# Patient Record
Sex: Male | Born: 1970 | Race: White | Hispanic: No | Marital: Married | Smoking: Current every day smoker
Health system: Southern US, Community
[De-identification: ages and names within clinical notes are randomized; demographics above are authoritative.]

## PROBLEM LIST (undated history)

## (undated) DIAGNOSIS — F329 Major depressive disorder, single episode, unspecified: Secondary | ICD-10-CM

## (undated) DIAGNOSIS — K8020 Calculus of gallbladder without cholecystitis without obstruction: Secondary | ICD-10-CM

## (undated) DIAGNOSIS — M1990 Unspecified osteoarthritis, unspecified site: Secondary | ICD-10-CM

## (undated) DIAGNOSIS — F419 Anxiety disorder, unspecified: Secondary | ICD-10-CM

## (undated) DIAGNOSIS — E785 Hyperlipidemia, unspecified: Secondary | ICD-10-CM

## (undated) DIAGNOSIS — Z8719 Personal history of other diseases of the digestive system: Secondary | ICD-10-CM

## (undated) DIAGNOSIS — K2270 Barrett's esophagus without dysplasia: Secondary | ICD-10-CM

## (undated) DIAGNOSIS — R0600 Dyspnea, unspecified: Secondary | ICD-10-CM

## (undated) DIAGNOSIS — F32A Depression, unspecified: Secondary | ICD-10-CM

## (undated) DIAGNOSIS — K219 Gastro-esophageal reflux disease without esophagitis: Secondary | ICD-10-CM

## (undated) DIAGNOSIS — J189 Pneumonia, unspecified organism: Secondary | ICD-10-CM

## (undated) DIAGNOSIS — I209 Angina pectoris, unspecified: Secondary | ICD-10-CM

## (undated) HISTORY — PX: BONE MARROW ASPIRATION: SHX1252

## (undated) HISTORY — PX: OTHER SURGICAL HISTORY: SHX169

## (undated) HISTORY — PX: NO PAST SURGERIES: SHX2092

## (undated) HISTORY — DX: Barrett's esophagus without dysplasia: K22.70

## (undated) HISTORY — PX: CHOLECYSTECTOMY: SHX55

---

## 2015-10-06 ENCOUNTER — Emergency Department: Payer: BLUE CROSS/BLUE SHIELD

## 2015-10-06 ENCOUNTER — Encounter: Payer: Self-pay | Admitting: *Deleted

## 2015-10-06 ENCOUNTER — Emergency Department
Admission: EM | Admit: 2015-10-06 | Discharge: 2015-10-06 | Disposition: A | Discharge status: Home / Self Care | Payer: BLUE CROSS/BLUE SHIELD | Attending: Emergency Medicine | Admitting: Emergency Medicine

## 2015-10-06 DIAGNOSIS — R1010 Upper abdominal pain, unspecified: Secondary | ICD-10-CM | POA: Insufficient documentation

## 2015-10-06 DIAGNOSIS — R1013 Epigastric pain: Secondary | ICD-10-CM | POA: Diagnosis not present

## 2015-10-06 DIAGNOSIS — R079 Chest pain, unspecified: Secondary | ICD-10-CM | POA: Diagnosis present

## 2015-10-06 DIAGNOSIS — F17200 Nicotine dependence, unspecified, uncomplicated: Secondary | ICD-10-CM | POA: Diagnosis not present

## 2015-10-06 HISTORY — DX: Anxiety disorder, unspecified: F41.9

## 2015-10-06 HISTORY — DX: Gastro-esophageal reflux disease without esophagitis: K21.9

## 2015-10-06 HISTORY — DX: Depression, unspecified: F32.A

## 2015-10-06 HISTORY — DX: Major depressive disorder, single episode, unspecified: F32.9

## 2015-10-06 LAB — BASIC METABOLIC PANEL
Anion gap: 7 (ref 5–15)
BUN: 10 mg/dL (ref 6–20)
CALCIUM: 9.7 mg/dL (ref 8.9–10.3)
CHLORIDE: 103 mmol/L (ref 101–111)
CO2: 27 mmol/L (ref 22–32)
CREATININE: 1.08 mg/dL (ref 0.61–1.24)
Glucose, Bld: 104 mg/dL — ABNORMAL HIGH (ref 65–99)
Potassium: 4.5 mmol/L (ref 3.5–5.1)
SODIUM: 137 mmol/L (ref 135–145)

## 2015-10-06 LAB — CBC
HCT: 49.5 % (ref 40.0–52.0)
Hemoglobin: 17.3 g/dL (ref 13.0–18.0)
MCH: 33.3 pg (ref 26.0–34.0)
MCHC: 34.9 g/dL (ref 32.0–36.0)
MCV: 95.4 fL (ref 80.0–100.0)
PLATELETS: 191 10*3/uL (ref 150–440)
RBC: 5.19 MIL/uL (ref 4.40–5.90)
RDW: 12.7 % (ref 11.5–14.5)
WBC: 7.9 10*3/uL (ref 3.8–10.6)

## 2015-10-06 LAB — LIPASE, BLOOD: LIPASE: 44 U/L (ref 11–51)

## 2015-10-06 LAB — TROPONIN I

## 2015-10-06 LAB — HEPATIC FUNCTION PANEL
ALBUMIN: 4.6 g/dL (ref 3.5–5.0)
ALT: 36 U/L (ref 17–63)
AST: 23 U/L (ref 15–41)
Alkaline Phosphatase: 63 U/L (ref 38–126)
Bilirubin, Direct: <0.1 mg/dL — ABNORMAL LOW (ref 0.1–0.5)
TOTAL PROTEIN: 7.8 g/dL (ref 6.5–8.1)
Total Bilirubin: 0.8 mg/dL (ref 0.3–1.2)

## 2015-10-06 MED ORDER — SUCRALFATE 1 G PO TABS: 1.0000 g | ORAL_TABLET | Freq: Four times a day (QID) | ORAL | Status: DC

## 2015-10-06 NOTE — Discharge Instructions (Signed)
Please seek medical attention for any high fevers, chest pain, shortness of breath, change in behavior, persistent vomiting, bloody stool or any other new or concerning symptoms. ° ° °Gastritis, Adult °Gastritis is soreness and swelling (inflammation) of the lining of the stomach. Gastritis can develop as a sudden onset (acute) or long-term (chronic) condition. If gastritis is not treated, it can lead to stomach bleeding and ulcers. °CAUSES  °Gastritis occurs when the stomach lining is weak or damaged. Digestive juices from the stomach then inflame the weakened stomach lining. The stomach lining may be weak or damaged due to viral or bacterial infections. One common bacterial infection is the Helicobacter pylori infection. Gastritis can also result from excessive alcohol consumption, taking certain medicines, or having too much acid in the stomach.  °SYMPTOMS  °In some cases, there are no symptoms. When symptoms are present, they may include: °· Pain or a burning sensation in the upper abdomen. °· Nausea. °· Vomiting. °· An uncomfortable feeling of fullness after eating. °DIAGNOSIS  °Your caregiver may suspect you have gastritis based on your symptoms and a physical exam. To determine the cause of your gastritis, your caregiver may perform the following: °· Blood or stool tests to check for the H pylori bacterium. °· Gastroscopy. A thin, flexible tube (endoscope) is passed down the esophagus and into the stomach. The endoscope has a light and camera on the end. Your caregiver uses the endoscope to view the inside of the stomach. °· Taking a tissue sample (biopsy) from the stomach to examine under a microscope. °TREATMENT  °Depending on the cause of your gastritis, medicines may be prescribed. If you have a bacterial infection, such as an H pylori infection, antibiotics may be given. If your gastritis is caused by too much acid in the stomach, H2 blockers or antacids may be given. Your caregiver may recommend that  you stop taking aspirin, ibuprofen, or other nonsteroidal anti-inflammatory drugs (NSAIDs). °HOME CARE INSTRUCTIONS °· Only take over-the-counter or prescription medicines as directed by your caregiver. °· If you were given antibiotic medicines, take them as directed. Finish them even if you start to feel better. °· Drink enough fluids to keep your urine clear or pale yellow. °· Avoid foods and drinks that make your symptoms worse, such as: °¨ Caffeine or alcoholic drinks. °¨ Chocolate. °¨ Peppermint or mint flavorings. °¨ Garlic and onions. °¨ Spicy foods. °¨ Citrus fruits, such as oranges, lemons, or limes. °¨ Tomato-based foods such as sauce, chili, salsa, and pizza. °¨ Fried and fatty foods. °· Eat small, frequent meals instead of large meals. °SEEK IMMEDIATE MEDICAL CARE IF:  °· You have black or dark red stools. °· You vomit blood or material that looks like coffee grounds. °· You are unable to keep fluids down. °· Your abdominal pain gets worse. °· You have a fever. °· You do not feel better after 1 week. °· You have any other questions or concerns. °MAKE SURE YOU: °· Understand these instructions. °· Will watch your condition. °· Will get help right away if you are not doing well or get worse. °  °This information is not intended to replace advice given to you by your health care provider. Make sure you discuss any questions you have with your health care provider. °  °Document Released: 11/01/2001 Document Revised: 05/08/2012 Document Reviewed: 12/21/2011 °Elsevier Interactive Patient Education ©2016 [employer] ° °

## 2015-10-06 NOTE — ED Notes (Signed)
Lab called about adding hepatic function panel and lipase.

## 2015-10-06 NOTE — ED Notes (Signed)
Lab ([place]) called again about lipase and hepatic function panel. Will check on results.

## 2015-10-06 NOTE — ED Provider Notes (Signed)
[facility] Emergency Department Provider Note    ____________________________________________  Time seen: 1725  I have reviewed the triage vital signs and the nursing notes.   HISTORY  Chief Complaint Chest Pain   History limited by: Not Limited   HPI Phillip Finley is a 44 y.o. male who presents to the emergency department today because of concerns for intermittent chest pain. He describes the pain as stabbing. It will come and go. He has not identified any eliciting or relieving factors. He describes it being located in the epigastric and low mid chest. He denies any radiation. He has had some associated nausea. He denies any chest pain or diaphoresis. Denies any fevers.   Past Medical History  Diagnosis Date  . GERD (gastroesophageal reflux disease)   . Anxiety   . Depression     There are no active problems to display for this patient.   History reviewed. No pertinent past surgical history.  No current outpatient prescriptions on file.  Allergies Review of patient's allergies indicates no known allergies.  No family history on file.  Social History Social History  Substance Use Topics  . Smoking status: Current Every Day Smoker  . Smokeless tobacco: None  . Alcohol Use: No    Review of Systems  Constitutional: Negative for fever. Cardiovascular: Positive for lower chest pain Respiratory: Negative for shortness of breath. Gastrointestinal: Positive for upper abdominal pain. Genitourinary: Negative for dysuria. Musculoskeletal: Negative for back pain. Skin: Negative for rash. Neurological: Negative for headaches, focal weakness or numbness.  10-point ROS otherwise negative.  ____________________________________________   PHYSICAL EXAM:  VITAL SIGNS: ED Triage Vitals  Enc Vitals Group     BP 10/06/15 1637 120/73 mmHg     Pulse Rate 10/06/15 1637 68     Resp 10/06/15 1637 24     Temp 10/06/15 1637 98.1 F (36.7 C)   Temp Source 10/06/15 1637 Oral     SpO2 10/06/15 1637 100 %     Weight 10/06/15 1637 220 lb (99.791 kg)     Height 10/06/15 1637 5\' 10"  (1.778 m)     Head Cir --      Peak Flow --      Pain Score 10/06/15 1638 7   Constitutional: Alert and oriented. Well appearing and in no distress. Eyes: Conjunctivae are normal. PERRL. Normal extraocular movements. ENT   Head: Normocephalic and atraumatic.   Nose: No congestion/rhinnorhea.   Mouth/Throat: Mucous membranes are moist.   Neck: No stridor. Hematological/Lymphatic/Immunilogical: No cervical lymphadenopathy. Cardiovascular: Normal rate, regular rhythm.  No murmurs, rubs, or gallops. Respiratory: Normal respiratory effort without tachypnea nor retractions. Breath sounds are clear and equal bilaterally. No wheezes/rales/rhonchi. Gastrointestinal: Soft and nontender. No distention. There is no CVA tenderness. Genitourinary: Deferred Musculoskeletal: Normal range of motion in all extremities. No joint effusions.  No lower extremity tenderness nor edema. Neurologic:  Normal speech and language. No gross focal neurologic deficits are appreciated.  Skin:  Skin is warm, dry and intact. No rash noted. Psychiatric: Mood and affect are normal. Speech and behavior are normal. Patient exhibits appropriate insight and judgment.  ____________________________________________    LABS (pertinent positives/negatives)  Labs Reviewed  BASIC METABOLIC PANEL - Abnormal; Notable for the following:    Glucose, Bld 104 (*)    All other components within normal limits  HEPATIC FUNCTION PANEL - Abnormal; Notable for the following:    Bilirubin, Direct <0.1 (*)    All other components within normal limits  CBC  TROPONIN I  LIPASE, BLOOD     ____________________________________________   EKG  I, Nance Pear, attending physician, personally viewed and interpreted this EKG  EKG Time: 1635 Rate: 67 Rhythm: NSR Axis: normal Intervals:  qtc 395 QRS: narrow ST changes: no st elevation Impression: normal ekg ____________________________________________    RADIOLOGY  CXR IMPRESSION: No active cardiopulmonary disease.   ____________________________________________   PROCEDURES  Procedure(s) performed: None  Critical Care performed: No  ____________________________________________   INITIAL IMPRESSION / ASSESSMENT AND PLAN / ED COURSE  Pertinent labs & imaging results that were available during my care of the patient were reviewed by me and considered in my medical decision making (see chart for details).  Patient presented today because of intermittent lower chest/epigastric pain. On exam patient appears well. He did not have any abdominal tenderness. Blood work without any concerning findings. I did perform a bedside ultrasound which did not show any gallbladder stones. This point unclear however I think likely gastritis. Additionally patient did state that he tried some Mylanta which helped relieve some of his pain. Will discharge home with sucralfate. Patient states he is already on an antiacid. Discussed return precautions.  ____________________________________________   FINAL CLINICAL IMPRESSION(S) / ED DIAGNOSES  Final diagnoses:  Epigastric pain     Nance Pear, MD 10/06/15 1946

## 2015-10-06 NOTE — ED Notes (Signed)
Episodes of stabbing feeling in chest past few days, some nausea, today felt cold and had nausea, pain more severe today

## 2015-11-02 ENCOUNTER — Encounter: Payer: Self-pay | Admitting: Family Medicine

## 2015-11-02 ENCOUNTER — Ambulatory Visit (INDEPENDENT_AMBULATORY_CARE_PROVIDER_SITE_OTHER): Payer: BLUE CROSS/BLUE SHIELD | Admitting: Family Medicine

## 2015-11-02 VITALS — BP 133/83 | HR 74 | Temp 98.3°F | Resp 16 | Ht 70.0 in | Wt 228.2 lb

## 2015-11-02 DIAGNOSIS — Z720 Tobacco use: Secondary | ICD-10-CM

## 2015-11-02 DIAGNOSIS — Z8249 Family history of ischemic heart disease and other diseases of the circulatory system: Secondary | ICD-10-CM | POA: Diagnosis not present

## 2015-11-02 DIAGNOSIS — E785 Hyperlipidemia, unspecified: Secondary | ICD-10-CM

## 2015-11-02 DIAGNOSIS — F419 Anxiety disorder, unspecified: Principal | ICD-10-CM

## 2015-11-02 DIAGNOSIS — K2270 Barrett's esophagus without dysplasia: Secondary | ICD-10-CM

## 2015-11-02 DIAGNOSIS — E559 Vitamin D deficiency, unspecified: Secondary | ICD-10-CM

## 2015-11-02 DIAGNOSIS — F329 Major depressive disorder, single episode, unspecified: Secondary | ICD-10-CM

## 2015-11-02 DIAGNOSIS — F411 Generalized anxiety disorder: Secondary | ICD-10-CM | POA: Insufficient documentation

## 2015-11-02 DIAGNOSIS — F418 Other specified anxiety disorders: Secondary | ICD-10-CM | POA: Diagnosis not present

## 2015-11-02 DIAGNOSIS — F17200 Nicotine dependence, unspecified, uncomplicated: Secondary | ICD-10-CM

## 2015-11-02 DIAGNOSIS — F32A Depression, unspecified: Secondary | ICD-10-CM

## 2015-11-02 MED ORDER — OMEPRAZOLE 20 MG PO CPDR: 20.0000 mg | DELAYED_RELEASE_CAPSULE | Freq: Every day | ORAL | Status: DC

## 2015-11-02 NOTE — Assessment & Plan Note (Signed)
Lipid panel to stratify risk. Recommend daily aspirin to reduce cardiovascular risk after GI evaluation.  Discussed possibility of seeing cardiology for risk evaluation. Pt declines at this time.

## 2015-11-02 NOTE — Assessment & Plan Note (Signed)
Continue omeprazole. Refer to GI  For evaluation.

## 2015-11-02 NOTE — Patient Instructions (Signed)
Anxiety: We will try without medication for a little bit. Please go to the ER if you feel you are a danger to yourself.   GERD: Please take your omeprazole daily.  We will have you over to Dr. Allen Norris to check on your Barretts Esophagitis.   Please get your labwork done and we will discuss a cholesterol medication.

## 2015-11-02 NOTE — Assessment & Plan Note (Signed)
In the past. Check Vitamin D. Recommend 2000IU vitamin D OTC for prophylaxis.

## 2015-11-02 NOTE — Assessment & Plan Note (Signed)
Encouraged smoking cessation. Pt is not ready at this time. Discussed increased risk for heart disease with smoking. 5 minutes counseling given.

## 2015-11-02 NOTE — Progress Notes (Signed)
Subjective:    Patient ID: Phillip Finley, male    DOB: June 23, 1971, 44 y.o.   MRN: [id number]  HPI: Phillip Finley is a 43 y.o. male presenting on 11/02/2015 for Establish Care   HPI  Pt presents to establish care today. Previous care provider was in [place].  It has been 2 months since His last PCP visit. Records from previous provider will be requested and reviewed. Current medical problems include:  Depression: Previous PCP had placed him on lamictal. He stopped taking it. Was also previously take risperdal and lexapro. He is not taking any medication.Currently out of work due to the medications he was on. Is endorsing it dark thoughts he might be better off dead due to stress in his life. Denies active suicidal ideation or plans.  100mg  of lamictal; 1 mg risperadol Recently in ER for gastritis: Did not take carafate. Has history of barretts esophagitis. Not currently taking omeprazole.  High cholesterol: was on medication in the past.  Vitamin D deficiency: IN the past- was taking vitamin D.   Smoker- 2 packs per day. No formal exercise.      Past Medical History  Diagnosis Date  . GERD (gastroesophageal reflux disease)   . Anxiety   . Depression    Social History   Social History  . Marital Status: Single    Spouse Name: N/A  . Number of Children: N/A  . Years of Education: N/A   Occupational History  . Not on file.   Social History Main Topics  . Smoking status: Current Every Day Smoker -- 2.00 packs/day  . Smokeless tobacco: Not on file  . Alcohol Use: No  . Drug Use: No  . Sexual Activity: Not on file   Other Topics Concern  . Not on file   Social History Narrative   Family History  Problem Relation Age of Onset  . Heart disease Father    No current outpatient prescriptions on file prior to visit.   No current facility-administered medications on file prior to visit.    Review of Systems  Constitutional: Negative for fever and chills.  HENT: Negative.    Respiratory: Negative for chest tightness, shortness of breath and wheezing.   Cardiovascular: Negative for chest pain, palpitations and leg swelling.  Gastrointestinal: Negative for nausea, vomiting and abdominal pain.  Endocrine: Negative.   Genitourinary: Negative for dysuria, urgency, discharge, penile pain and testicular pain.  Musculoskeletal: Negative for back pain, joint swelling and arthralgias.  Skin: Negative.   Neurological: Negative for dizziness, weakness, numbness and headaches.  Psychiatric/Behavioral: Negative for sleep disturbance and dysphoric mood.   Per HPI unless specifically indicated above     Objective:    BP 133/83 mmHg  Pulse 74  Temp(Src) 98.3 F (36.8 C) (Oral)  Resp 16  Ht 5\' 10"  (1.778 m)  Wt 228 lb 3.2 oz (103.511 kg)  BMI 32.74 kg/m2  Wt Readings from Last 3 Encounters:  11/02/15 228 lb 3.2 oz (103.511 kg)  10/06/15 220 lb (99.791 kg)    Physical Exam  Constitutional: He is oriented to person, place, and time. He appears well-developed and well-nourished. No distress.  HENT:  Head: Normocephalic and atraumatic.  Neck: Neck supple. No thyromegaly present.  Cardiovascular: Normal rate, regular rhythm and normal heart sounds.  Exam reveals no gallop and no friction rub.   No murmur heard. Pulmonary/Chest: Effort normal and breath sounds normal. He has no wheezes.  Abdominal: Soft. Normal appearance and bowel sounds are normal. He  exhibits no distension. There is no hepatosplenomegaly. There is tenderness in the epigastric area. There is no rebound and no CVA tenderness.  Musculoskeletal: Normal range of motion. He exhibits no edema or tenderness.  Neurological: He is alert and oriented to person, place, and time. He has normal reflexes.  Skin: Skin is warm and dry. No rash noted. No erythema.  Psychiatric: He has a normal mood and affect. His behavior is normal. Thought content normal.   Results for orders placed or performed during the  hospital encounter of [id number]  Basic metabolic panel  Result Value Ref Range   Sodium 137 135 - 145 mmol/L   Potassium 4.5 3.5 - 5.1 mmol/L   Chloride 103 101 - 111 mmol/L   CO2 27 22 - 32 mmol/L   Glucose, Bld 104 (H) 65 - 99 mg/dL   BUN 10 6 - 20 mg/dL   Creatinine, Ser 1.08 0.61 - 1.24 mg/dL   Calcium 9.7 8.9 - 10.3 mg/dL   GFR calc non Af Amer >60 >60 mL/min   GFR calc Af Amer >60 >60 mL/min   Anion gap 7 5 - 15  CBC  Result Value Ref Range   WBC 7.9 3.8 - 10.6 K/uL   RBC 5.19 4.40 - 5.90 MIL/uL   Hemoglobin 17.3 13.0 - 18.0 g/dL   HCT 49.5 40.0 - 52.0 %   MCV 95.4 80.0 - 100.0 fL   MCH 33.3 26.0 - 34.0 pg   MCHC 34.9 32.0 - 36.0 g/dL   RDW 12.7 11.5 - 14.5 %   Platelets 191 150 - 440 K/uL  Troponin I  Result Value Ref Range   Troponin I <0.03 <0.031 ng/mL  Lipase, blood  Result Value Ref Range   Lipase 44 11 - 51 U/L  Hepatic function panel  Result Value Ref Range   Total Protein 7.8 6.5 - 8.1 g/dL   Albumin 4.6 3.5 - 5.0 g/dL   AST 23 15 - 41 U/L   ALT 36 17 - 63 U/L   Alkaline Phosphatase 63 38 - 126 U/L   Total Bilirubin 0.8 0.3 - 1.2 mg/dL   Bilirubin, Direct <0.1 (L) 0.1 - 0.5 mg/dL   Indirect Bilirubin NOT CALCULATED 0.3 - 0.9 mg/dL      Assessment & Plan:   Problem List Items Addressed This Visit      Digestive   Barrett's esophagus    Continue omeprazole. Refer to GI  For evaluation.       Relevant Medications   omeprazole (PRILOSEC) 20 MG capsule   Other Relevant Orders   Comprehensive Metabolic Panel (CMET)   CBC with Differential/Platelet   Ambulatory referral to General Surgery     Other   Anxiety and depression - Primary    Pt would like to be off medication at this time.  Discussed exercise as a way to improve symptoms. 30 mins daily. Encouraged fish oil and vitamin D.  Due to self reported dark thoughts without SI: Safety contract made. Pt is aware to go to ER if he feels he is a danger to himself. Aware to call provider if thoughts  increase in frequency.       Relevant Orders   TSH   Vitamin D deficiency    In the past. Check Vitamin D. Recommend 2000IU vitamin D OTC for prophylaxis.      Relevant Orders   VITAMIN D 25 Hydroxy (Vit-D Deficiency, Fractures)   Hyperlipidemia    Check lipid panel. Start  statin based on ASCVD score. Encouraged diet and lifestyle changes. Encouraged fish oil 1000mg  OTC daily.       Relevant Orders   Lipid Profile   Smoker    Encouraged smoking cessation. Pt is not ready at this time. Discussed increased risk for heart disease with smoking. 5 minutes counseling given.       Family history of heart disease in male family member before age 105    Lipid panel to stratify risk. Recommend daily aspirin to reduce cardiovascular risk after GI evaluation.  Discussed possibility of seeing cardiology for risk evaluation. Pt declines at this time.          Meds ordered this encounter  Medications  . omeprazole (PRILOSEC) 20 MG capsule    Sig: Take 1 capsule (20 mg total) by mouth daily.    Dispense:  90 capsule    Refill:  3    Order Specific Question:  Supervising Provider    Answer:  Arlis Porta [id number]      Follow up plan: Return in about 4 weeks (around 11/30/2015) for Anxiety. Marland Kitchen

## 2015-11-02 NOTE — Assessment & Plan Note (Signed)
Pt would like to be off medication at this time.  Discussed exercise as a way to improve symptoms. 30 mins daily. Encouraged fish oil and vitamin D.  Due to self reported dark thoughts without SI: Safety contract made. Pt is aware to go to ER if he feels he is a danger to himself. Aware to call provider if thoughts increase in frequency.

## 2015-11-02 NOTE — Assessment & Plan Note (Signed)
Check lipid panel. Start statin based on ASCVD score. Encouraged diet and lifestyle changes. Encouraged fish oil 1000mg  OTC daily.

## 2015-11-04 ENCOUNTER — Telehealth: Payer: Self-pay | Admitting: Family Medicine

## 2015-11-04 NOTE — Telephone Encounter (Signed)
Called pt, LVM that forms are complete. Faxed back. I will leave copy at the front if he needs them. AK

## 2015-11-05 ENCOUNTER — Ambulatory Visit
Admission: EM | Admit: 2015-11-05 | Discharge: 2015-11-05 | Disposition: A | Discharge status: Home / Self Care | Payer: Self-pay | Attending: Family Medicine | Admitting: Family Medicine

## 2015-11-05 ENCOUNTER — Encounter: Payer: Self-pay | Admitting: Emergency Medicine

## 2015-11-05 DIAGNOSIS — Z029 Encounter for administrative examinations, unspecified: Secondary | ICD-10-CM

## 2015-11-05 DIAGNOSIS — Z024 Encounter for examination for driving license: Secondary | ICD-10-CM

## 2015-11-05 LAB — DEPT OF TRANSP DIPSTICK, URINE (ARMC ONLY)
Glucose, UA: NEGATIVE mg/dL
Hgb urine dipstick: NEGATIVE
Protein, ur: NEGATIVE mg/dL
Specific Gravity, Urine: >1.03 — ABNORMAL HIGH (ref 1.005–1.030)

## 2015-11-05 NOTE — ED Notes (Signed)
Patient here for DOT Physical.  

## 2015-11-05 NOTE — ED Notes (Signed)
Patient states to Tamala Ser, PA that he is tired of waiting and is leaving.

## 2015-11-05 NOTE — ED Notes (Signed)
For DOT Physical. Here earlier and walked out. [place] had called 2 hours ago and spoke with Tamala Ser PA

## 2015-11-05 NOTE — ED Provider Notes (Signed)
CSN: [id number]     Arrival date & time 11/05/15  1455 History   First MD Initiated Contact with Patient 11/05/15 1515     Chief Complaint  Patient presents with  . DOT Physical    (Consider location/radiation/quality/duration/timing/severity/associated sxs/prior Treatment) HPI  Patient presents for a DOT physical. Initially left without being seen dating he waited too long a period. He returned several hours later. He states that his only problem is some GERD which he takes  omeprazole. Stated that he did take Lamictal scribed by his primary care physician; that it affected his driving status so he discontinued its use and states that he is no longer taking that medication.  denies any surgical past history. He denies any heart disease signs or symptoms  of obstructive sleep apnea or narcolepsy. Problem list also has imitated vitamin D deficiency and Barrett's esophagitis along with his anxiety depression.  Past Medical History  Diagnosis Date  . GERD (gastroesophageal reflux disease)   . Anxiety   . Depression    No past surgical history on file. Family History  Problem Relation Age of Onset  . Heart disease Father    Social History  Substance Use Topics  . Smoking status: Current Every Day Smoker -- 2.00 packs/day  . Smokeless tobacco: Not on file  . Alcohol Use: No    Review of Systems  All other systems reviewed and are negative.   Allergies  Review of patient's allergies indicates no known allergies.  Home Medications   Prior to Admission medications   Medication Sig Start Date End Date Taking? Authorizing Provider  omeprazole (PRILOSEC) 20 MG capsule Take 1 capsule (20 mg total) by mouth daily. 11/02/15   Amy Overton Mam, NP   Meds Ordered and Administered this Visit  Medications - No data to display  There were no vitals taken for this visit. No data found.   Physical Exam  Constitutional:  Referred to the DOT physical form  Nursing note and vitals  reviewed.   ED Course  Procedures (including critical care time)  Labs Review Labs Reviewed - No data to display  Imaging Review No results found.   Visual Acuity Review  Right Eye Distance:   Left Eye Distance:   Bilateral Distance:    Right Eye Near:   Left Eye Near:    Bilateral Near:         MDM   1. Driver's permit physical examination        Lorin Picket, PA-C 11/05/15 1559

## 2015-11-05 NOTE — ED Notes (Signed)
Patient left without being seen by a provider.

## 2015-12-08 ENCOUNTER — Ambulatory Visit: Payer: Self-pay | Admitting: Family Medicine

## 2015-12-22 ENCOUNTER — Ambulatory Visit
Admission: EM | Admit: 2015-12-22 | Discharge: 2015-12-22 | Disposition: A | Discharge status: Home / Self Care | Payer: BLUE CROSS/BLUE SHIELD

## 2015-12-22 NOTE — ED Notes (Signed)
Not seen by Provider. For DOT random urine only

## 2016-06-16 ENCOUNTER — Ambulatory Visit
Admission: EM | Admit: 2016-06-16 | Discharge: 2016-06-16 | Disposition: A | Discharge status: Home / Self Care | Payer: BLUE CROSS/BLUE SHIELD | Attending: Family Medicine | Admitting: Family Medicine

## 2016-06-16 DIAGNOSIS — F419 Anxiety disorder, unspecified: Secondary | ICD-10-CM | POA: Diagnosis not present

## 2016-06-16 DIAGNOSIS — F329 Major depressive disorder, single episode, unspecified: Secondary | ICD-10-CM | POA: Diagnosis not present

## 2016-06-16 DIAGNOSIS — F410 Panic disorder [episodic paroxysmal anxiety] without agoraphobia: Secondary | ICD-10-CM

## 2016-06-16 DIAGNOSIS — F32A Depression, unspecified: Secondary | ICD-10-CM

## 2016-06-16 MED ORDER — ESCITALOPRAM OXALATE 20 MG PO TABS: 20.0000 mg | ORAL_TABLET | Freq: Every day | ORAL | 0 refills | Status: DC

## 2016-06-16 NOTE — ED Triage Notes (Signed)
Patient states that he has been under a lot more stress recently. Patient states that he is not currently on medication for anxiety. Patient states that he has been on Risperdal, lamictal and lexapro in the past but, has not been on anything recently. Patient states that he feels physically drained and states that he thinks everything has caught up with him. Patient states that he has also been unable to sleep.

## 2016-06-16 NOTE — ED Provider Notes (Signed)
[facility]    CSN: [id number] Arrival date & time: 06/16/16  1756  First Provider Contact:  First MD Initiated Contact with Patient 06/16/16 1928        History   Chief Complaint Chief Complaint  Patient presents with  . Anxiety    HPI Phillip Finley is a 45 y.o. male.   Patient has had history of anxiety panic attacks for number of years. Apparently he's been on multiple medications for this in the past. Recently about 2 months ago he came off all his medications and been doing well. However his workplace is changed his routine from going in about 2:00 in getting off at 2 or 3 in the morning to going with a 6 coming home about 6:54 in the morning. States this past week with this new change has really impacted him negatively. He states he comes very anxious nervousness. Difficulty sleeping during the day and he states this be very lucky fever gets 6 hours of sleep in. States that since getting 3 and 4:00 the morning and feels that since abnormal from still be driving at that time. He reports overall feeling jittery. He's taken medications such as Lexapro Risperdal and Desyrel just to name a few.   He states the chest pain has now spread consistent with the chest pain he has had before he's had multiple EKGs for 5 states those come back normal. And this pain is not constant but comes and goes consistent with his panic and anxiety. He denies want to hurt himself or hurt others and denies any may have to look for another job that unable to move back as scheduled. He does have a PCP who is not seen since November December 2016. At times does get blood work for other medical problems such as hypogonadism and he never did follow through.   The history is provided by the patient and the spouse. No language interpreter was used.  Anxiety  This is a new problem. The current episode started more than 2 days ago. The problem occurs constantly. The problem has not changed since  onset.Associated symptoms include chest pain and shortness of breath. Nothing aggravates the symptoms. Nothing relieves the symptoms. He has tried nothing for the symptoms. The treatment provided no relief.    Past Medical History:  Diagnosis Date  . Anxiety   . Depression   . GERD (gastroesophageal reflux disease)     Patient Active Problem List   Diagnosis Date Noted  . Anxiety and depression 11/02/2015  . Barrett's esophagus 11/02/2015  . Vitamin D deficiency 11/02/2015  . Hyperlipidemia 11/02/2015  . Smoker 11/02/2015  . Family history of heart disease in male family member before age 66 11/02/2015    Past Surgical History:  Procedure Laterality Date  . NO PAST SURGERIES         Home Medications    Prior to Admission medications   Medication Sig Start Date End Date Taking? Authorizing Provider  escitalopram (LEXAPRO) 20 MG tablet Take 1 tablet (20 mg total) by mouth daily. Start off with half a tablet a day for a week and then go up to a whole tablet a day. 06/16/16   Frederich Cha, MD  omeprazole (PRILOSEC) 20 MG capsule Take 1 capsule (20 mg total) by mouth daily. 11/02/15   Amy Overton Mam, NP    Family History Family History  Problem Relation Age of Onset  . Heart disease Father     Social History Social History  Substance Use Topics  . Smoking status: Current Every Day Smoker    Packs/day: 2.00  . Smokeless tobacco: Never Used  . Alcohol use Yes     Comment: seldom     Allergies   Review of patient's allergies indicates no known allergies.   Review of Systems Review of Systems  Constitutional: Negative.   Respiratory: Positive for shortness of breath.   Cardiovascular: Positive for chest pain.  All other systems reviewed and are negative.    Physical Exam Triage Vital Signs ED Triage Vitals  Enc Vitals Group     BP 06/16/16 1909 114/75     Pulse Rate 06/16/16 1909 62     Resp 06/16/16 1909 17     Temp 06/16/16 1909 97.7 F (36.5 C)      Temp Source 06/16/16 1909 Tympanic     SpO2 06/16/16 1909 99 %     Weight 06/16/16 1909 202 lb (91.6 kg)     Height 06/16/16 1909 5\' 10"  (1.778 m)     Head Circumference --      Peak Flow --      Pain Score 06/16/16 1912 0     Pain Loc --      Pain Edu? --      Excl. in [place]? --    No data found.   Updated Vital Signs BP 114/75 (BP Location: Left Arm)   Pulse 62   Temp 97.7 F (36.5 C) (Tympanic)   Resp 17   Ht 5\' 10"  (1.778 m)   Wt 202 lb (91.6 kg)   SpO2 99%   BMI 28.98 kg/m   Visual Acuity Right Eye Distance:   Left Eye Distance:   Bilateral Distance:    Right Eye Near:   Left Eye Near:    Bilateral Near:     Physical Exam  Constitutional: He appears well-developed and well-nourished.  HENT:  Head: Atraumatic.  Eyes: Pupils are equal, round, and reactive to light.  Neck: Normal range of motion.  Cardiovascular: Normal rate, regular rhythm and normal heart sounds.   Pulmonary/Chest: Effort normal.  Musculoskeletal: Normal range of motion.  Neurological: He is alert.  Skin: Skin is warm.  Psychiatric: His speech is normal and behavior is normal. Judgment and thought content normal. His mood appears anxious. Thought content is not paranoid and not delusional. Cognition and memory are normal. He does not express impulsivity. He exhibits a depressed mood. He expresses no homicidal and no suicidal ideation. He expresses no suicidal plans and no homicidal plans. He is attentive.     UC Treatments / Results  Labs (all labs ordered are listed, but only abnormal results are displayed) Labs Reviewed - No data to display  EKG  EKG Interpretation None      ED ECG REPORT I, Kyley Laurel H, the attending physician, personally viewed and interpreted this ECG.   Date: 06/16/2016  EKG Time:19:59:57  Rate: 59  Rhythm: there are no previous tracings available for comparison, sinus bradycardia  Axis: 14  Intervals:none  ST&T Change: none Radiology No results  found.  Procedures Procedures (including critical care time)  Medications Ordered in UC Medications - No data to display   Initial Impression / Assessment and Plan / UC Course  I have reviewed the triage vital signs and the nursing notes.  Pertinent labs & imaging results that were available during my care of the patient were reviewed by me and considered in my medical decision making (see chart for details).  Clinical Course    We'll obtain an EKG. EKG should be unremarkable. If it is unremarkable as expected we'll plan to place him on Lexapro 20 mg from him take half tablet for a week and then increase to a whole tablet daily stressed importance following up with his PCP. Will give a work note for today and tomorrow.  Final Clinical Impressions(s) / UC Diagnoses   Final diagnoses:  Anxiety  Panic attack  Anxiety disorder, unspecified anxiety disorder type  Depression    New Prescriptions New Prescriptions   ESCITALOPRAM (LEXAPRO) 20 MG TABLET    Take 1 tablet (20 mg total) by mouth daily. Start off with half a tablet a day for a week and then go up to a whole tablet a day.     Frederich Cha, MD 06/16/16 2019

## 2016-06-29 ENCOUNTER — Encounter: Payer: Self-pay | Admitting: Family Medicine

## 2016-06-29 ENCOUNTER — Ambulatory Visit (INDEPENDENT_AMBULATORY_CARE_PROVIDER_SITE_OTHER): Payer: BLUE CROSS/BLUE SHIELD | Admitting: Family Medicine

## 2016-06-29 VITALS — BP 116/70 | HR 56 | Temp 98.0°F | Resp 16 | Ht 70.0 in | Wt 210.0 lb

## 2016-06-29 DIAGNOSIS — F419 Anxiety disorder, unspecified: Principal | ICD-10-CM

## 2016-06-29 DIAGNOSIS — F329 Major depressive disorder, single episode, unspecified: Secondary | ICD-10-CM

## 2016-06-29 DIAGNOSIS — E559 Vitamin D deficiency, unspecified: Secondary | ICD-10-CM | POA: Diagnosis not present

## 2016-06-29 DIAGNOSIS — E785 Hyperlipidemia, unspecified: Secondary | ICD-10-CM

## 2016-06-29 DIAGNOSIS — F418 Other specified anxiety disorders: Secondary | ICD-10-CM | POA: Diagnosis not present

## 2016-06-29 DIAGNOSIS — F32A Depression, unspecified: Secondary | ICD-10-CM

## 2016-06-29 DIAGNOSIS — K2270 Barrett's esophagus without dysplasia: Secondary | ICD-10-CM | POA: Diagnosis not present

## 2016-06-29 MED ORDER — ESCITALOPRAM OXALATE 10 MG PO TABS: 10.0000 mg | ORAL_TABLET | Freq: Every day | ORAL | 11 refills | Status: DC

## 2016-06-29 NOTE — Assessment & Plan Note (Signed)
Pt not taking PPI at this time. Refer to GI for EGD to monitor and determine if chronic PPI therapy needed. No acid symptoms at this time. Will check CBC today.

## 2016-06-29 NOTE — Progress Notes (Signed)
Subjective:    Patient ID: Phillip Finley, male    DOB: [birth year], 45 y.o.   MRN: [id number]  HPI: Phillip Finley is a 45 y.o. male presenting on 06/29/2016 for Anxiety (meds not improving Sx)   HPI  Pt presents for anxiety follow-up. Was seen at urgent care and they started him on Lexapro for anxiety but he did not take the medication. He is having panic symptoms- ECG were normal at urgent care. Reports mild panic symptoms every day. 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 Is having a hard time sleeping during the day.  Issues with anxiety and depression- was previously on zolfot, risperadone, lexapro, lamictal.  Barrett's Esophagus- stopped prevacid.   Past Medical History:  Diagnosis Date  . Anxiety   . Depression   . GERD (gastroesophageal reflux disease)     No current outpatient prescriptions on file prior to visit.   No current facility-administered medications on file prior to visit.     Review of Systems  Constitutional: Negative for chills and fever.  HENT: Negative.   Respiratory: Negative for chest tightness, shortness of breath and wheezing.   Cardiovascular: Negative for chest pain, palpitations and leg swelling.  Gastrointestinal: Negative for abdominal pain, nausea and vomiting.  Endocrine: Negative.   Genitourinary: Negative for discharge, dysuria, penile pain, testicular pain and urgency.  Musculoskeletal: Negative for arthralgias, back pain and joint swelling.  Skin: Negative.   Neurological: Negative for dizziness, weakness, numbness and headaches.  Psychiatric/Behavioral: Positive for sleep disturbance. Negative for dysphoric mood. The patient is nervous/anxious.    Per HPI unless specifically indicated above     Objective:    BP 116/70 (BP Location: Right Arm, Patient Position: Sitting, Cuff Size: Normal)   Pulse (!) 56   Temp 98 F (36.7 C) (Oral)   Resp 16   Ht 5\' 10"  (1.778 m)   Wt 210 lb (95.3 kg)   BMI 30.13  kg/m   Wt Readings from Last 3 Encounters:  06/29/16 210 lb (95.3 kg)  06/16/16 202 lb (91.6 kg)  11/05/15 229 lb (103.9 kg)    Physical Exam  Constitutional: He is oriented to person, place, and time. He appears well-developed and well-nourished. No distress.  HENT:  Head: Normocephalic and atraumatic.  Neck: Neck supple. No thyromegaly present.  Cardiovascular: Normal rate, regular rhythm and normal heart sounds.  Exam reveals no gallop and no friction rub.   No murmur heard. Pulmonary/Chest: Effort normal and breath sounds normal. He has no wheezes.  Abdominal: Soft. Bowel sounds are normal. He exhibits no distension. There is no tenderness. There is no rebound.  Musculoskeletal: Normal range of motion. He exhibits no edema or tenderness.  Neurological: He is alert and oriented to person, place, and time. He has normal reflexes.  Skin: Skin is warm and dry. No rash noted. No erythema.  Psychiatric: He has a normal mood and affect. His behavior is normal. Thought content normal.   Results for orders placed or performed during the hospital encounter of 11/05/15  Dept of Transp dipstick, urine  Result Value Ref Range   Protein, ur NEGATIVE NEGATIVE mg/dL   Glucose, UA NEGATIVE NEGATIVE mg/dL   Specific Gravity, Urine >1.030 (H) 1.005 - 1.030   Hgb urine dipstick NEGATIVE NEGATIVE      Assessment & Plan:   Problem List Items Addressed This Visit      Digestive   Barrett's esophagus    Pt not taking  PPI at this time. Refer to GI for EGD to monitor and determine if chronic PPI therapy needed. No acid symptoms at this time. Will check CBC today.       Relevant Orders   COMPLETE METABOLIC PANEL WITH GFR   CBC with Differential   Ambulatory referral to Gastroenterology     Other   Anxiety and depression - Primary    Pt is amenable to starting lexapro today. Will check labs for exacerbating factors. Recheck 4 weeks.       Relevant Medications   escitalopram (LEXAPRO) 10 MG  tablet   Other Relevant Orders   Vitamin D (25 hydroxy)   TSH   Vitamin B12   Vitamin D deficiency    Check vitamin D levels today.       Relevant Orders   Vitamin D (25 hydroxy)   Hyperlipidemia    Check lipid panel.       Relevant Orders   Lipid Profile    Other Visit Diagnoses   None.     Meds ordered this encounter  Medications  . escitalopram (LEXAPRO) 10 MG tablet    Sig: Take 1 tablet (10 mg total) by mouth daily.    Dispense:  30 tablet    Refill:  11    Order Specific Question:   Supervising Provider    Answer:   Arlis Porta [id number]      Follow up plan: Return in about 4 weeks (around 07/27/2016), or if symptoms worsen or fail to improve.

## 2016-06-29 NOTE — Assessment & Plan Note (Signed)
Check lipid panel  

## 2016-06-29 NOTE — Assessment & Plan Note (Signed)
Pt is amenable to starting lexapro today. Will check labs for exacerbating factors. Recheck 4 weeks.

## 2016-06-29 NOTE — Patient Instructions (Signed)
Let's try Lexapro for your anxiety. Start by taking 1/2 tablet at your bedtime for 4 days. Then take the full dosing.   Also try melatonin at your bedtime to help with your shift issues.  If it continues let me know.    We will check some labs to determine if anything is causing your symptoms.

## 2016-06-29 NOTE — Assessment & Plan Note (Signed)
Check vitamin D levels today. 

## 2016-06-30 LAB — COMPLETE METABOLIC PANEL WITH GFR
ALBUMIN: 4.3 g/dL (ref 3.6–5.1)
ALK PHOS: 56 U/L (ref 40–115)
ALT: 21 U/L (ref 9–46)
AST: 16 U/L (ref 10–40)
BILIRUBIN TOTAL: 0.6 mg/dL (ref 0.2–1.2)
BUN: 12 mg/dL (ref 7–25)
CALCIUM: 9.1 mg/dL (ref 8.6–10.3)
CO2: 23 mmol/L (ref 20–31)
Chloride: 106 mmol/L (ref 98–110)
Creat: 0.97 mg/dL (ref 0.60–1.35)
GLUCOSE: 108 mg/dL — AB (ref 65–99)
POTASSIUM: 4.3 mmol/L (ref 3.5–5.3)
SODIUM: 141 mmol/L (ref 135–146)
TOTAL PROTEIN: 6.6 g/dL (ref 6.1–8.1)

## 2016-06-30 LAB — TSH: TSH: 1.29 mIU/L (ref 0.40–4.50)

## 2016-06-30 LAB — CBC WITH DIFFERENTIAL/PLATELET
BASOS ABS: 65 {cells}/uL (ref 0–200)
Basophils Relative: 1 %
EOS ABS: 195 {cells}/uL (ref 15–500)
Eosinophils Relative: 3 %
HEMATOCRIT: 45.4 % (ref 38.5–50.0)
HEMOGLOBIN: 15.8 g/dL (ref 13.2–17.1)
LYMPHS ABS: 2145 {cells}/uL (ref 850–3900)
LYMPHS PCT: 33 %
MCH: 33.3 pg — ABNORMAL HIGH (ref 27.0–33.0)
MCHC: 34.8 g/dL (ref 32.0–36.0)
MCV: 95.8 fL (ref 80.0–100.0)
MONO ABS: 520 {cells}/uL (ref 200–950)
MPV: 10.2 fL (ref 7.5–12.5)
Monocytes Relative: 8 %
NEUTROS PCT: 55 %
Neutro Abs: 3575 cells/uL (ref 1500–7800)
Platelets: 179 10*3/uL (ref 140–400)
RBC: 4.74 MIL/uL (ref 4.20–5.80)
RDW: 13.1 % (ref 11.0–15.0)
WBC: 6.5 10*3/uL (ref 3.8–10.8)

## 2016-06-30 LAB — LIPID PANEL
CHOL/HDL RATIO: 5.4 ratio — AB (ref <=5.0)
CHOLESTEROL: 209 mg/dL — AB (ref 125–200)
HDL: 39 mg/dL — AB (ref >=40)
LDL Cholesterol: 131 mg/dL — ABNORMAL HIGH (ref <130)
TRIGLYCERIDES: 194 mg/dL — AB (ref <150)
VLDL: 39 mg/dL — ABNORMAL HIGH (ref <30)

## 2016-06-30 LAB — VITAMIN D 25 HYDROXY (VIT D DEFICIENCY, FRACTURES): VIT D 25 HYDROXY: 25 ng/mL — AB (ref 30–100)

## 2016-06-30 LAB — VITAMIN B12: Vitamin B-12: 346 pg/mL (ref 200–1100)

## 2016-07-06 ENCOUNTER — Encounter: Payer: Self-pay | Admitting: Family Medicine

## 2016-07-12 ENCOUNTER — Ambulatory Visit (INDEPENDENT_AMBULATORY_CARE_PROVIDER_SITE_OTHER): Payer: BLUE CROSS/BLUE SHIELD | Admitting: Family Medicine

## 2016-07-12 VITALS — BP 126/80 | HR 57 | Temp 98.0°F | Resp 16 | Ht 70.0 in | Wt 209.0 lb

## 2016-07-12 DIAGNOSIS — F419 Anxiety disorder, unspecified: Principal | ICD-10-CM

## 2016-07-12 DIAGNOSIS — R7309 Other abnormal glucose: Secondary | ICD-10-CM | POA: Diagnosis not present

## 2016-07-12 DIAGNOSIS — Z8249 Family history of ischemic heart disease and other diseases of the circulatory system: Secondary | ICD-10-CM

## 2016-07-12 DIAGNOSIS — F32A Depression, unspecified: Secondary | ICD-10-CM

## 2016-07-12 DIAGNOSIS — R739 Hyperglycemia, unspecified: Secondary | ICD-10-CM

## 2016-07-12 DIAGNOSIS — R0789 Other chest pain: Secondary | ICD-10-CM

## 2016-07-12 DIAGNOSIS — F418 Other specified anxiety disorders: Secondary | ICD-10-CM

## 2016-07-12 DIAGNOSIS — F329 Major depressive disorder, single episode, unspecified: Secondary | ICD-10-CM

## 2016-07-12 LAB — POCT GLYCOSYLATED HEMOGLOBIN (HGB A1C): HEMOGLOBIN A1C: 5.6

## 2016-07-12 MED ORDER — LORAZEPAM 0.5 MG PO TABS: 0.2500 mg | ORAL_TABLET | Freq: Two times a day (BID) | ORAL | 1 refills | Status: DC | PRN

## 2016-07-12 MED ORDER — VENLAFAXINE HCL ER 37.5 MG PO TB24: 1.0000 | ORAL_TABLET | Freq: Every day | ORAL | 11 refills | Status: DC

## 2016-07-12 NOTE — Patient Instructions (Addendum)
We will have you seen by cardiology for your chest pain.  Please seek immediate medical attention at ER or Urgent Care if you develop: Chest pain, pressure or tightness. Shortness of breath accompanied by nausea or diaphoresis Visual changes Numbness or tingling on one side of the body Facial droop Altered mental status Or any concerning symptoms.  For your anxiety- stop Lexapro- take 1/2 tablet for 3 days and then start the Venlafaxine 37.5mg  once daily. Take once daily for 1 week and then can increase to 2 tablets once daily if needed.  Can take 1/2 tablet of Ativan as needed to help with anxiety. Recommend taking 1 at bedtime to help with anxiety symptoms at bedtime. Our goal is not to take this medication daily but only as needed.

## 2016-07-12 NOTE — Progress Notes (Signed)
Subjective:    Patient ID: Phillip Finley, male    DOB: 07-30-1971, 45 y.o.   MRN: [id number]  HPI: Phillip Finley is a 45 y.o. male presenting on 07/12/2016 for Anxiety (getting worst)   HPI  Pt presents for follow-up of anxiety. Symptoms seem to be getting worse. Still having chest pains. Was seen at urgent care- thought to be anxiety related but they are still occurring. Is having diarrhea and nausea from the lexapro. Anxiety- taking full 10mg  tablet at this time. Causing diarrhea and nausea. Feels like anxiety is getting worse. Sleeping 2-3 hours tops. Having a tingling sensation on the legs- this wakes him up 3-4 times in the last week. If he gets up and moves around, it will get better. Pt is on leave from his job at this time. Is switching from night-shift. Up until 3 am- waking up by 6am- has trouble shutting his brain off. Is feeling very tired but cannot sleep.  Chest pains: Substernal chest pain. Pt describes it as a pressing feeling on L side of the sternum or a tightness. Pressure is reported as a 6/10. Pains are reported to last about 10 minutes. Symptoms have occurred at rest. Pt does reports that pains usually occur when he is trying to relax and is feeling anxious. Symptoms have been present since July 27- but are worsening over the past 2 weeks. Is feeling weak and tired all the time. No associated shortness of breath or diaphoresis. Father has a significant history of MI at age 35. Pt is also a smoker.   Past Medical History:  Diagnosis Date  . Anxiety   . Depression   . GERD (gastroesophageal reflux disease)     No current outpatient prescriptions on file prior to visit.   No current facility-administered medications on file prior to visit.     Review of Systems  Constitutional: Negative for chills and fever.  HENT: Negative.   Respiratory: Negative for chest tightness, shortness of breath and wheezing.   Cardiovascular: Negative for chest pain, palpitations and leg  swelling.  Gastrointestinal: Negative for abdominal pain, nausea and vomiting.  Endocrine: Negative.   Genitourinary: Negative for discharge, dysuria, penile pain, testicular pain and urgency.  Musculoskeletal: Negative for arthralgias, back pain and joint swelling.  Skin: Negative.   Neurological: Negative for dizziness, weakness, numbness and headaches.  Psychiatric/Behavioral: Negative for dysphoric mood and sleep disturbance.   Per HPI unless specifically indicated above     Objective:    BP 126/80 (BP Location: Left Arm, Patient Position: Sitting, Cuff Size: Normal)   Pulse (!) 57   Temp 98 F (36.7 C) (Oral)   Resp 16   Ht 5\' 10"  (1.778 m)   Wt 209 lb (94.8 kg)   BMI 29.99 kg/m   Wt Readings from Last 3 Encounters:  07/12/16 209 lb (94.8 kg)  06/29/16 210 lb (95.3 kg)  06/16/16 202 lb (91.6 kg)    Physical Exam  Constitutional: He is oriented to person, place, and time. He appears well-developed and well-nourished. No distress.  HENT:  Head: Normocephalic and atraumatic.  Neck: Neck supple. No thyromegaly present.  Cardiovascular: Normal rate, regular rhythm and normal heart sounds.  Exam reveals no gallop and no friction rub.   No murmur heard. Pulmonary/Chest: Effort normal and breath sounds normal. He has no wheezes.  Abdominal: Soft. Bowel sounds are normal. He exhibits no distension. There is no tenderness. There is no rebound.  Musculoskeletal: Normal range of motion. He exhibits  no edema or tenderness.  Neurological: He is alert and oriented to person, place, and time. He has normal reflexes.  Skin: Skin is warm and dry. No rash noted. No erythema.  Psychiatric: His behavior is normal. Thought content normal. His mood appears anxious. He expresses no suicidal ideation.   GAD 7 : Generalized Anxiety Score 07/12/2016 06/29/2016  Nervous, Anxious, on Edge 3 3  Control/stop worrying 3 2  Worry too much - different things 2 3  Trouble relaxing 3 2  Restless 2 3    Easily annoyed or irritable 2 1  Afraid - awful might happen 3 2  Total GAD 7 Score 18 16  Anxiety Difficulty Somewhat difficult Somewhat difficult     Results for orders placed or performed in visit on 07/12/16  POCT HgB A1C  Result Value Ref Range   Hemoglobin A1C 5.6       Assessment & Plan:   Problem List Items Addressed This Visit      Other   Anxiety and depression - Primary    Took pt out of work 2/2 anxiety and chest pain. Stop lexapro 2/2 side effects. Will trial venlafaxine once daily to help with symptoms. Add short-term ativan 1/2 tablet to help with severe anxiety symptoms. Recheck 3 weeks to determine if medication will work and release back to work.  Consider counseling or psychiatry if symptoms are refractory.       Relevant Medications   Venlafaxine HCl 37.5 MG TB24   LORazepam (ATIVAN) 0.5 MG tablet   Family history of heart disease in male family member before age 52    Other Visit Diagnoses    Elevated random blood glucose level       HgA1c normal. Likely 2/2 non-fasting.    Relevant Orders   POCT HgB A1C (Completed)   Chest tightness or pressure       ECG WNL. However 2/2 chest pressure at rest, smoking status, and family Hx will refer to cardiology for eval. To ER with severe symptoms. Symptoms could be anxiety related.    Relevant Orders   EKG 12-Lead   Ambulatory referral to Cardiology      Meds ordered this encounter  Medications  . Venlafaxine HCl 37.5 MG TB24    Sig: Take 1 tablet (37.5 mg total) by mouth daily.    Dispense:  30 each    Refill:  11    Order Specific Question:   Supervising Provider    Answer:   Arlis Porta [phone]  . LORazepam (ATIVAN) 0.5 MG tablet    Sig: Take 0.5 tablets (0.25 mg total) by mouth 2 (two) times daily as needed for anxiety.    Dispense:  30 tablet    Refill:  1    Order Specific Question:   Supervising Provider    Answer:   Arlis Porta [id number]      Follow up plan: Return in  about 2 weeks (around 07/26/2016), or if symptoms worsen or fail to improve, for Anxiety. Marland Kitchen

## 2016-07-12 NOTE — Assessment & Plan Note (Signed)
Took pt out of work 2/2 anxiety and chest pain. Stop lexapro 2/2 side effects. Will trial venlafaxine once daily to help with symptoms. Add short-term ativan 1/2 tablet to help with severe anxiety symptoms. Recheck 3 weeks to determine if medication will work and release back to work.  Consider counseling or psychiatry if symptoms are refractory.

## 2016-08-01 ENCOUNTER — Encounter: Payer: Self-pay | Admitting: Family Medicine

## 2016-08-01 ENCOUNTER — Ambulatory Visit: Payer: Self-pay | Admitting: Cardiovascular Disease

## 2016-08-01 ENCOUNTER — Ambulatory Visit (INDEPENDENT_AMBULATORY_CARE_PROVIDER_SITE_OTHER): Payer: BLUE CROSS/BLUE SHIELD | Admitting: Family Medicine

## 2016-08-01 VITALS — BP 138/87 | HR 62 | Temp 97.7°F | Resp 16 | Ht 70.0 in | Wt 214.0 lb

## 2016-08-01 DIAGNOSIS — F418 Other specified anxiety disorders: Secondary | ICD-10-CM

## 2016-08-01 DIAGNOSIS — F419 Anxiety disorder, unspecified: Secondary | ICD-10-CM

## 2016-08-01 DIAGNOSIS — K2270 Barrett's esophagus without dysplasia: Secondary | ICD-10-CM | POA: Diagnosis not present

## 2016-08-01 DIAGNOSIS — R079 Chest pain, unspecified: Secondary | ICD-10-CM

## 2016-08-01 DIAGNOSIS — F329 Major depressive disorder, single episode, unspecified: Secondary | ICD-10-CM

## 2016-08-01 MED ORDER — VENLAFAXINE HCL ER 75 MG PO CP24: 75.0000 mg | ORAL_CAPSULE | Freq: Every day | ORAL | 11 refills | Status: DC

## 2016-08-01 MED ORDER — OMEPRAZOLE 20 MG PO CPDR: 20.0000 mg | DELAYED_RELEASE_CAPSULE | Freq: Every day | ORAL | 3 refills | Status: DC

## 2016-08-01 NOTE — Assessment & Plan Note (Signed)
Pt saw Gi today. Refill of omeprazole given once daily.

## 2016-08-01 NOTE — Assessment & Plan Note (Signed)
Increase to 75mg  on venlafaxine. Recommend counseling for patient given passive SI. Contract for safety made. Pt to ER if he feels like he is a danger to himself. Pt is amenable to counseling today. Contact information for Karen [place] at [facility] given.  Consider psychiatry if symptoms do not improve with counseling as pt has failed several SSRIs, SNRIs, and atypicals

## 2016-08-01 NOTE — Progress Notes (Signed)
Subjective:    Patient ID: Phillip Finley, male    DOB: 1971-06-22, 45 y.o.   MRN: [id number]  HPI: Phillip Finley is a 45 y.o. male presenting on 08/01/2016 for Anxiety (3 week follow up not helping panic attacks gets less frequent as per pt) and Depression (getting worst)   HPI  Pt presents for follow-up of depression and anxiety. Feels like symptoms are getting worse. Feels either up or down. Very anxious or very depression. He is out of work due to anxiety. Scheduled to return on 10/30- he can go back sooner if he feels well. Only taking 1 tablet (37.5mg ) once daily. He feels like it has helped with panic symptoms. Was previously on Risperdal, Lamictal,  Endorses some passive SI- feels like life would be better without him here. No active plan to kill himself. Would not kill himself. Is willing to go to counseling today.  He will see Dr. Fletcher Anon for chest pains and palpitations on 9/14 (thursday). Chest pains are still occurring. Thinks it could be reflux. Saw GI this morning. Scheduled for EGD on 11/22/2016   Past Medical History:  Diagnosis Date  . Anxiety   . Depression   . GERD (gastroesophageal reflux disease)     Current Outpatient Prescriptions on File Prior to Visit  Medication Sig  . LORazepam (ATIVAN) 0.5 MG tablet Take 0.5 tablets (0.25 mg total) by mouth 2 (two) times daily as needed for anxiety.   No current facility-administered medications on file prior to visit.     Review of Systems  Constitutional: Negative for chills and fever.  HENT: Negative.   Respiratory: Negative for chest tightness, shortness of breath and wheezing.   Cardiovascular: Positive for chest pain. Negative for palpitations and leg swelling.  Gastrointestinal: Negative for abdominal pain, nausea and vomiting.  Endocrine: Negative.   Genitourinary: Negative for discharge, dysuria, penile pain, testicular pain and urgency.  Musculoskeletal: Negative for arthralgias, back pain and joint swelling.  Skin:  Negative.   Neurological: Negative for dizziness, weakness, numbness and headaches.  Psychiatric/Behavioral: Positive for decreased concentration, dysphoric mood, sleep disturbance and suicidal ideas.   Per HPI unless specifically indicated above     Objective:    BP 138/87 (BP Location: Left Arm, Patient Position: Sitting, Cuff Size: Normal)   Pulse 62   Temp 97.7 F (36.5 C) (Oral)   Resp 16   Ht 5\' 10"  (1.778 m)   Wt 214 lb (97.1 kg)   BMI 30.71 kg/m   Wt Readings from Last 3 Encounters:  08/01/16 214 lb (97.1 kg)  07/12/16 209 lb (94.8 kg)  06/29/16 210 lb (95.3 kg)    GAD 7 : Generalized Anxiety Score 08/01/2016 07/12/2016 06/29/2016  Nervous, Anxious, on Edge 3 3 3   Control/stop worrying 3 3 2   Worry too much - different things 2 2 3   Trouble relaxing 3 3 2   Restless 2 2 3   Easily annoyed or irritable 1 2 1   Afraid - awful might happen 2 3 2   Total GAD 7 Score 16 18 16   Anxiety Difficulty Not difficult at all Somewhat difficult Somewhat difficult    Depression screen [facility] 2/9 08/01/2016 11/02/2015  Decreased Interest 3 1  Down, Depressed, Hopeless 2 2  PHQ - 2 Score 5 3  Altered sleeping 3 3  Tired, decreased energy 3 3  Change in appetite 1 1  Feeling bad or failure about yourself  2 2  Trouble concentrating 3 1  Moving slowly or fidgety/restless 3  2  Suicidal thoughts 2 1  PHQ-9 Score 22 16  Difficult doing work/chores Somewhat difficult Somewhat difficult    Physical Exam  Constitutional: He is oriented to person, place, and time. He appears well-developed and well-nourished. No distress.  HENT:  Head: Normocephalic and atraumatic.  Neck: Neck supple. No thyromegaly present.  Cardiovascular: Normal rate, regular rhythm and normal heart sounds.  Exam reveals no gallop and no friction rub.   No murmur heard. Pulmonary/Chest: Effort normal and breath sounds normal. He has no wheezes.  Abdominal: Soft. Bowel sounds are normal. He exhibits no distension. There  is no tenderness. There is no rebound.  Musculoskeletal: Normal range of motion. He exhibits no edema or tenderness.  Neurological: He is alert and oriented to person, place, and time. He has normal reflexes.  Skin: Skin is warm and dry. No rash noted. No erythema.  Psychiatric: His speech is normal and behavior is normal. Judgment normal. Cognition and memory are normal. He exhibits a depressed mood. He expresses suicidal (passive SI. No actual plan. Just thoughts that life would be easier if he were not here.) ideation. He expresses no suicidal plans and no homicidal plans.   Results for orders placed or performed in visit on 07/12/16  POCT HgB A1C  Result Value Ref Range   Hemoglobin A1C 5.6       Assessment & Plan:   Problem List Items Addressed This Visit      Digestive   Barrett's esophagus - Primary    Pt saw Gi today. Refill of omeprazole given once daily.       Relevant Medications   omeprazole (PRILOSEC) 20 MG capsule     Other   Anxiety and depression    Increase to 75mg  on venlafaxine. Recommend counseling for patient given passive SI. Contract for safety made. Pt to ER if he feels like he is a danger to himself. Pt is amenable to counseling today. Contact information for Karen San Marino at [facility] given.  Consider psychiatry if symptoms do not improve with counseling as pt has failed several SSRIs, SNRIs, and atypicals      Relevant Medications   venlafaxine XR (EFFEXOR-XR) 75 MG 24 hr capsule    Other Visit Diagnoses    Chest pain at rest       Pt to see cardiology on Thursday. To ER with severe chest pain, SOB, paplitations or other concerning symptoms.       Meds ordered this encounter  Medications  . omeprazole (PRILOSEC) 20 MG capsule    Sig: Take 1 capsule (20 mg total) by mouth daily.    Dispense:  90 capsule    Refill:  3    Order Specific Question:   Supervising Provider    Answer:   Arlis Porta [phone]  . venlafaxine XR (EFFEXOR-XR)  75 MG 24 hr capsule    Sig: Take 1 capsule (75 mg total) by mouth daily with breakfast.    Dispense:  30 capsule    Refill:  11    Order Specific Question:   Supervising Provider    Answer:   Arlis Porta [phone]      Follow up plan: Return in about 4 weeks (around 08/29/2016), or if symptoms worsen or fail to improve.

## 2016-08-01 NOTE — Patient Instructions (Addendum)
Lets increase the venlafaxine to 75mg  once daily. I also recommend seeking counseling with at [facility].  [phone] - Karen San [place]ino.   [email or web address] If you feel you are a danger to yourself or others- please go to the ER.

## 2016-08-04 ENCOUNTER — Encounter: Payer: Self-pay | Admitting: Cardiovascular Disease

## 2016-08-04 ENCOUNTER — Ambulatory Visit (INDEPENDENT_AMBULATORY_CARE_PROVIDER_SITE_OTHER): Payer: BLUE CROSS/BLUE SHIELD | Admitting: Cardiovascular Disease

## 2016-08-04 VITALS — BP 120/82 | HR 67 | Ht 70.0 in | Wt 213.5 lb

## 2016-08-04 DIAGNOSIS — Z720 Tobacco use: Secondary | ICD-10-CM

## 2016-08-04 DIAGNOSIS — R079 Chest pain, unspecified: Secondary | ICD-10-CM | POA: Diagnosis not present

## 2016-08-04 DIAGNOSIS — Z8249 Family history of ischemic heart disease and other diseases of the circulatory system: Secondary | ICD-10-CM

## 2016-08-04 NOTE — Progress Notes (Signed)
Cardiology Office Note   Date:  08/04/2016   ID:  Phillip Finley, DOB [birth year], MRN [id number]  PCP:  Leata Mouse, NP  Cardiologist:   Kathlyn Sacramento, MD   Chief Complaint  Patient presents with  . other    Ref by Dr. Vincenza Hews for chest pain. Meds reviewed by the pt. verbally. Pt. c/o chest pain       History of Present Illness: Phillip Finley is a 45 y.o. male who Was referred by Amy Krebs for evaluation of chest pain. He has no previous cardiac history. He has multiple chronic medical conditions that include hyperlipidemia, tobacco use,, GERD, anxiety and family history of premature coronary artery disease. His father died at the age of 71 of myocardial infarction. The patient had no recent cardiac evaluation. He has suffered from severe GERD symptoms for a while with Barrett's esophagus. This frequently leads to symptoms of chest pain. However, recently he started experiencing some different types of substernal chest pain described as sharp and heavy feeling lasting anywhere from 10-15 minutes. The pain does not radiate and typically happens at rest or when he is anxious. Does not worsen with physical activities. He has mild exertional dyspnea. He has been experiencing debilitating anxiety symptoms as well.    Past Medical History:  Diagnosis Date  . Anxiety   . Depression   . GERD (gastroesophageal reflux disease)     Past Surgical History:  Procedure Laterality Date  . NO PAST SURGERIES       Current Outpatient Prescriptions  Medication Sig Dispense Refill  . LORazepam (ATIVAN) 0.5 MG tablet Take 0.5 tablets (0.25 mg total) by mouth 2 (two) times daily as needed for anxiety. 30 tablet 1  . omeprazole (PRILOSEC) 20 MG capsule Take 1 capsule (20 mg total) by mouth daily. 90 capsule 3  . venlafaxine XR (EFFEXOR-XR) 75 MG 24 hr capsule Take 1 capsule (75 mg total) by mouth daily with breakfast. 30 capsule 11   No current facility-administered medications for this visit.      Allergies:   Review of patient's allergies indicates no known allergies.    Social History:  The patient  reports that he has been smoking.  He has been smoking about 2.00 packs per day. He has never used smokeless tobacco. He reports that he drinks alcohol. He reports that he does not use drugs.   Family History:  The patient's family history includes Heart attack (age of onset: 53) in his father; Heart disease in his father; Hyperlipidemia in his mother; Hypertension in his mother.    ROS:  Please see the history of present illness.   Otherwise, review of systems are positive for none.   All other systems are reviewed and negative.    PHYSICAL EXAM: VS:  Ht 5\' 10"  (1.778 m)   Wt 213 lb 8 oz (96.8 kg)   BMI 30.63 kg/m  , BMI Body mass index is 30.63 kg/m. GEN: Well nourished, well developed, in no acute distress  HEENT: normal  Neck: no JVD, carotid bruits, or masses Cardiac: RRR; no murmurs, rubs, or gallops,no edema  Respiratory:  clear to auscultation bilaterally, normal work of breathing GI: soft, nontender, nondistended, + BS MS: no deformity or atrophy  Skin: warm and dry, no rash Neuro:  Strength and sensation are intact Psych: euthymic mood, full affect   EKG:  EKG is ordered today. The ekg ordered today demonstrates normal sinus rhythm with no significant ST or T wave changes.  Recent Labs: 06/29/2016: ALT 21; BUN 12; Creat 0.97; Hemoglobin 15.8; Platelets 179; Potassium 4.3; Sodium 141; TSH 1.29    Lipid Panel    Component Value Date/Time   CHOL 209 (H) 06/29/2016 1203   TRIG 194 (H) 06/29/2016 1203   HDL 39 (L) 06/29/2016 1203   CHOLHDL 5.4 (H) 06/29/2016 1203   VLDL 39 (H) 06/29/2016 1203   LDLCALC 131 (H) 06/29/2016 1203      Wt Readings from Last 3 Encounters:  08/04/16 213 lb 8 oz (96.8 kg)  08/01/16 214 lb (97.1 kg)  07/12/16 209 lb (94.8 kg)       PAD Screen 08/04/2016  Previous PAD dx? No  Previous surgical procedure? No  Pain with  walking? No  Feet/toe relief with dangling? No  Painful, non-healing ulcers? No  Extremities discolored? No      ASSESSMENT AND PLAN:  1.  Atypical chest pain: His symptoms are overall atypical and could be related to GERD and also worsened by anxiety. Nonetheless, he has multiple risk factors for coronary artery disease. His cardiac exam is unremarkable and baseline ECG is normal. I recommend evaluation with a treadmill stress test. I discussed with him the importance of healthy lifestyle changes in order to decrease cardiovascular events.  2. Tobacco use: I discussed with him the importance of smoking cessation.   Disposition:   FU with me as needed.   Signed,  Kathlyn Sacramento, MD  08/04/2016 9:44 AM    [place]

## 2016-08-04 NOTE — Patient Instructions (Addendum)
Medication Instructions:  Your physician recommends that you continue on your current medications as directed. Please refer to the Current Medication list given to you today.   Labwork: none  Testing/Procedures: Your physician has requested that you have an exercise tolerance test. For further information please visit [email or web address]. Please also follow instruction sheet, as given.  Thursday, September 21 @ 3pm  Follow-Up: Your physician recommends that you schedule a follow-up appointment as needed.    Any Other Special Instructions Will Be Listed Below (If Applicable).     If you need a refill on your cardiac medications before your next appointment, please call your pharmacy.   Exercise Stress Electrocardiogram An exercise stress electrocardiogram is a test that is done to evaluate the blood supply to your heart. This test may also be called exercise stress electrocardiography. The test is done while you are walking on a treadmill. The goal of this test is to raise your heart rate. This test is done to find areas of poor blood flow to the heart by determining the extent of coronary artery disease (CAD).   CAD is defined as narrowing in one or more heart (coronary) arteries of more than 70%. If you have an abnormal test result, this may mean that you are not getting adequate blood flow to your heart during exercise. Additional testing may be needed to understand why your test was abnormal. LET [facility] CARE PROVIDER KNOW ABOUT:   Any allergies you have.  All medicines you are taking, including vitamins, herbs, eye drops, creams, and over-the-counter medicines.  Previous problems you or members of your family have had with the use of anesthetics.  Any blood disorders you have.  Previous surgeries you have had.  Medical conditions you have.  Possibility of pregnancy, if this applies. RISKS AND COMPLICATIONS Generally, this is a safe procedure. However, as with any  procedure, complications can occur. Possible complications can include:  Pain or pressure in the following areas:  Chest.  Jaw or neck.  Between your shoulder blades.  Radiating down your left arm.  Dizziness or light-headedness.  Shortness of breath.  Increased or irregular heartbeats.  Nausea or vomiting.  Heart attack (rare). BEFORE THE PROCEDURE  Avoid all forms of caffeine 24 hours before your test or as directed by your health care provider. This includes coffee, tea (even decaffeinated tea), caffeinated sodas, chocolate, cocoa, and certain pain medicines.  Follow your health care provider's instructions regarding eating and drinking before the test.  Take your medicines as directed at regular times with water unless instructed otherwise. Exceptions may include:  If you have diabetes, ask how you are to take your insulin or pills. It is common to adjust insulin dosing the morning of the test.  If you are taking beta-blocker medicines, it is important to talk to your health care provider about these medicines well before the date of your test. Taking beta-blocker medicines may interfere with the test. In some cases, these medicines need to be changed or stopped 24 hours or more before the test.  If you wear a nitroglycerin patch, it may need to be removed prior to the test. Ask your health care provider if the patch should be removed before the test.  If you use an inhaler for any breathing condition, bring it with you to the test.  If you are an outpatient, bring a snack so you can eat right after the stress phase of the test.  Do not smoke for 4 hours prior  to the test or as directed by your health care provider.  Do not apply lotions, powders, creams, or oils on your chest prior to the test.  Wear loose-fitting clothes and comfortable shoes for the test. This test involves walking on a treadmill. PROCEDURE  Multiple patches (electrodes) will be put on your chest.  If needed, small areas of your chest may have to be shaved to get better contact with the electrodes. Once the electrodes are attached to your body, multiple wires will be attached to the electrodes and your heart rate will be monitored.  Your heart will be monitored both at rest and while exercising.  You will walk on a treadmill. The treadmill will be started at a slow pace. The treadmill speed and incline will gradually be increased to raise your heart rate. AFTER THE PROCEDURE  Your heart rate and blood pressure will be monitored after the test.  You may return to your normal schedule including diet, activities, and medicines, unless your health care provider tells you otherwise.   This information is not intended to replace advice given to you by your health care provider. Make sure you discuss any questions you have with your health care provider.   Document Released: 11/04/2000 Document Revised: 11/12/2013 Document Reviewed: 07/15/2013 Elsevier Interactive Patient Education [employer].

## 2016-08-08 ENCOUNTER — Telehealth: Payer: Self-pay | Admitting: Family Medicine

## 2016-08-08 NOTE — Telephone Encounter (Signed)
Called to let patient know we need a signed records release on file from [facility]. He will sign it today at his son's vaccine appt.

## 2016-08-17 ENCOUNTER — Ambulatory Visit (INDEPENDENT_AMBULATORY_CARE_PROVIDER_SITE_OTHER): Payer: BLUE CROSS/BLUE SHIELD

## 2016-08-17 DIAGNOSIS — R079 Chest pain, unspecified: Secondary | ICD-10-CM | POA: Diagnosis not present

## 2016-08-19 LAB — EXERCISE TOLERANCE TEST
CHL CUP RESTING HR STRESS: 71 {beats}/min
CSEPED: 4 min
CSEPHR: 85 %
Estimated workload: 6.9 METS
Exercise duration (sec): 57 s
MPHR: 175 {beats}/min
Peak HR: 150 {beats}/min

## 2016-08-29 ENCOUNTER — Encounter: Payer: Self-pay | Admitting: Family Medicine

## 2016-08-29 ENCOUNTER — Ambulatory Visit (INDEPENDENT_AMBULATORY_CARE_PROVIDER_SITE_OTHER): Payer: BLUE CROSS/BLUE SHIELD | Admitting: Family Medicine

## 2016-08-29 VITALS — BP 123/74 | HR 70 | Temp 98.3°F | Resp 16 | Ht 70.0 in | Wt 218.0 lb

## 2016-08-29 DIAGNOSIS — F419 Anxiety disorder, unspecified: Principal | ICD-10-CM

## 2016-08-29 DIAGNOSIS — F329 Major depressive disorder, single episode, unspecified: Secondary | ICD-10-CM

## 2016-08-29 DIAGNOSIS — F418 Other specified anxiety disorders: Secondary | ICD-10-CM

## 2016-08-29 DIAGNOSIS — F32A Depression, unspecified: Secondary | ICD-10-CM

## 2016-08-29 DIAGNOSIS — R45851 Suicidal ideations: Secondary | ICD-10-CM

## 2016-08-29 NOTE — Patient Instructions (Addendum)
I think at this time you need to see psychiatry and also go to [facility] for crisis counseling as we discussed in the visit.  I have spoken with the crisis counselor today and they are expecting you.  [facility] is located at: [address], [place], [place] [address]  IF at any point you feel you are a danger to yourself or other please go to the ER. Or call the [facility] Call [phone]  I will place a referral to [facility] today. If you decide to continue care with [facility]- please let our office know.

## 2016-08-29 NOTE — Progress Notes (Signed)
Subjective:    Patient ID: Phillip Finley, male    DOB: 02/13/1971, 45 y.o.   MRN: [id number]  HPI: Phillip Finley is a 45 y.o. male presenting on 08/29/2016 for Follow-up (depression and anxiety)   HPI  Pt presents for follow-up of depression and anxiety. He has started counseling at [facility]- feels his anxiety is better but the depression is major issue. The panic attacks are less frequent. He and counselor spoke in regards to anxiety but have not Suicidal thought- occurring 2-3 times per week.  Thoughts about wrapping an extension cord around his neck. Does not like the thoughts but finds himself thinking of ways to "end his misery."  Has not spoken with the counselor about this. Last seen 10 days ago. Feelings have intensified over the past couple of week.  When asked if he would act on them- he states" he has not yet and he is afraid of these feelings." Has not confided these feelings to family.   Past Medical History:  Diagnosis Date  . Anxiety   . Depression   . GERD (gastroesophageal reflux disease)     Current Outpatient Prescriptions on File Prior to Visit  Medication Sig  . LORazepam (ATIVAN) 0.5 MG tablet Take 0.5 tablets (0.25 mg total) by mouth 2 (two) times daily as needed for anxiety.  Marland Kitchen omeprazole (PRILOSEC) 20 MG capsule Take 1 capsule (20 mg total) by mouth daily.  Marland Kitchen venlafaxine XR (EFFEXOR-XR) 75 MG 24 hr capsule Take 1 capsule (75 mg total) by mouth daily with breakfast.   No current facility-administered medications on file prior to visit.     Review of Systems  Constitutional: Negative for chills and fever.  HENT: Negative.   Respiratory: Negative for chest tightness, shortness of breath and wheezing.   Cardiovascular: Negative for chest pain, palpitations and leg swelling.  Gastrointestinal: Negative for abdominal pain, nausea and vomiting.  Endocrine: Negative.   Genitourinary: Negative for discharge, dysuria, penile pain, testicular pain and  urgency.  Musculoskeletal: Negative for arthralgias, back pain and joint swelling.  Skin: Negative.   Neurological: Negative for dizziness, weakness, numbness and headaches.  Psychiatric/Behavioral: Positive for dysphoric mood and suicidal ideas. Negative for agitation, confusion and sleep disturbance. The patient is not nervous/anxious.    Per HPI unless specifically indicated above  Depression screen [facility] 2/9 08/29/2016 08/01/2016 11/02/2015  Decreased Interest 3 3 1   Down, Depressed, Hopeless 2 2 2   PHQ - 2 Score 5 5 3   Altered sleeping 3 3 3   Tired, decreased energy 3 3 3   Change in appetite 2 1 1   Feeling bad or failure about yourself  - 2 2  Trouble concentrating 1 3 1   Moving slowly or fidgety/restless 2 3 2   Suicidal thoughts 3 2 1   PHQ-9 Score 19 22 16   Difficult doing work/chores - Somewhat difficult Somewhat difficult     GAD 7 : Generalized Anxiety Score 08/29/2016 08/01/2016 07/12/2016 06/29/2016  Nervous, Anxious, on Edge 3 3 3 3   Control/stop worrying 2 3 3 2   Worry too much - different things 2 2 2 3   Trouble relaxing 2 3 3 2   Restless 2 2 2 3   Easily annoyed or irritable 1 1 2 1   Afraid - awful might happen 0 2 3 2   Total GAD 7 Score 12 16 18 16   Anxiety Difficulty Not difficult at all Not difficult at all Somewhat difficult Somewhat difficult         Objective:  BP 123/74 (BP Location: Right Arm, Patient Position: Sitting, Cuff Size: Large)   Pulse 70   Temp 98.3 F (36.8 C) (Oral)   Resp 16   Ht 5\' 10"  (1.778 m)   Wt 218 lb (98.9 kg)   SpO2 100%   BMI 31.28 kg/m   Wt Readings from Last 3 Encounters:  08/29/16 218 lb (98.9 kg)  08/04/16 213 lb 8 oz (96.8 kg)  08/01/16 214 lb (97.1 kg)    Physical Exam  Constitutional: He is oriented to person, place, and time. He appears well-developed and well-nourished. No distress.  HENT:  Head: Normocephalic and atraumatic.  Neck: Neck supple. No thyromegaly present.  Cardiovascular: Normal rate, regular  rhythm and normal heart sounds.  Exam reveals no gallop and no friction rub.   No murmur heard. Pulmonary/Chest: Effort normal and breath sounds normal. He has no wheezes.  Abdominal: Soft. Bowel sounds are normal. He exhibits no distension. There is no tenderness. There is no rebound.  Musculoskeletal: Normal range of motion. He exhibits no edema or tenderness.  Neurological: He is alert and oriented to person, place, and time. He has normal reflexes.  Skin: Skin is warm and dry. No rash noted. No erythema.  Psychiatric: His speech is normal and behavior is normal. Judgment normal. Cognition and memory are normal. He does not express impulsivity or inappropriate judgment. He exhibits a depressed mood. He expresses suicidal ideation. He expresses no suicidal plans.   Results for orders placed or performed in visit on 08/17/16  Exercise Tolerance Test  Result Value Ref Range   Rest HR 71 bpm   Rest BP 126/78 mmHg   Exercise duration (sec) 57 sec   Percent HR 85 %   Exercise duration (min) 4 min   Estimated workload 6.9 METS   Peak HR 150 bpm   Peak BP 166/61 mmHg   MPHR 175 bpm      Assessment & Plan:   Problem List Items Addressed This Visit      Other   Anxiety and depression - Primary    Given refractory symptoms and co-morbid SI- at this point pt needs psychiatry follow-up. No medication changes made today as patient was sent to [facility] for crisis counseling. Will place urgent referral to [facility].  Will keep him out of work longer to deal with current health issues.        Other Visit Diagnoses    Suicidal ideation       Increasing SI with no active plan but thoughts of how he could execute it. Pt does not pose danger to himself at this time.. Contract for safety made. Contacted [facility] counseling center to discuss crisis counseling.  Crisis stabilization today. To ER if he feels he is a danger to himself. Urgent referral to [facility] placed. Follow-up with psychiatry as directed.       No  orders of the defined types were placed in this encounter.     Follow up plan: Return if symptoms worsen or fail to improve, for With psychiatry. Marland Kitchen

## 2016-08-29 NOTE — Assessment & Plan Note (Addendum)
Given refractory symptoms and co-morbid SI- at this point pt needs psychiatry follow-up. No medication changes made today as patient was sent to [facility] for crisis counseling. Will place urgent referral to [facility].  Will keep him out of work longer to deal with current health issues.

## 2016-08-30 ENCOUNTER — Telehealth: Payer: Self-pay | Admitting: Family Medicine

## 2016-08-30 NOTE — Telephone Encounter (Signed)
Called pt to follow-up on yesterday's visit. Per wife he was sleeping. Will try back later.

## 2016-08-30 NOTE — Telephone Encounter (Signed)
Spoke with patient. [facility] crisis stabilization helped him yesterday. He will follow-up with [facility] tomorrow. Awaiting psychiatry referral.

## 2016-08-30 NOTE — Telephone Encounter (Signed)
Called pt. LMTCB 

## 2016-09-19 ENCOUNTER — Telehealth: Payer: Self-pay | Admitting: Family Medicine

## 2016-09-19 NOTE — Telephone Encounter (Signed)
Pt's wife Maudie Mercury asked which psychiatrist pt was referred to.  They are trying to call back to make appt and he doesn't remember.  Her call back number is [phone]

## 2016-09-19 NOTE — Telephone Encounter (Signed)
[facility] contact # [phone]. Left message.

## 2016-09-27 ENCOUNTER — Encounter (HOSPITAL_COMMUNITY): Payer: Self-pay

## 2016-09-29 ENCOUNTER — Ambulatory Visit (INDEPENDENT_AMBULATORY_CARE_PROVIDER_SITE_OTHER): Payer: BLUE CROSS/BLUE SHIELD | Admitting: Licensed Clinical Social Worker

## 2016-09-29 ENCOUNTER — Encounter: Payer: Self-pay | Admitting: Licensed Clinical Social Worker

## 2016-09-29 DIAGNOSIS — F322 Major depressive disorder, single episode, severe without psychotic features: Secondary | ICD-10-CM | POA: Diagnosis not present

## 2016-09-29 DIAGNOSIS — F419 Anxiety disorder, unspecified: Secondary | ICD-10-CM
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
## 2016-10-05 ENCOUNTER — Ambulatory Visit (INDEPENDENT_AMBULATORY_CARE_PROVIDER_SITE_OTHER): Payer: BLUE CROSS/BLUE SHIELD | Admitting: Family Medicine

## 2016-10-05 ENCOUNTER — Encounter: Payer: Self-pay | Admitting: Family Medicine

## 2016-10-05 VITALS — BP 143/83 | HR 78 | Temp 97.4°F | Resp 16 | Ht 70.0 in | Wt 222.4 lb

## 2016-10-05 DIAGNOSIS — F419 Anxiety disorder, unspecified: Secondary | ICD-10-CM

## 2016-10-05 DIAGNOSIS — F332 Major depressive disorder, recurrent severe without psychotic features: Secondary | ICD-10-CM

## 2016-10-05 DIAGNOSIS — G4701 Insomnia due to medical condition: Secondary | ICD-10-CM | POA: Diagnosis not present

## 2016-10-05 NOTE — Assessment & Plan Note (Addendum)
See A&P major depression 

## 2016-10-05 NOTE — Assessment & Plan Note (Addendum)
Secondary to significant depression and anxiety, currently uncontrolled. - Treat underlying problem first - handout for improving sleep hygiene, may take melatonin as needed

## 2016-10-05 NOTE — Patient Instructions (Signed)
Thank you for coming in to clinic today.  1. Follow-up as scheduled with [facility], this is the next best step to managing your Mood Disorder, Depression, Anxiety, and Insomnia (as discussed I think the sleep is most definitely related to your underlying problem) - Take current med with Ativan as needed, it can help sleep at night if needed as well but long term use is not ideal as reviewed, if you use it too much it can make anxiety symptoms worse or cause your body to become dependent on it - You can continue Melatonin as needed for sleep  Try to follow the following advice for proper sleep hygiene, this will help but ultimately I do not think anything will correct the problem until your depression is treated  Sleep Hygiene Recommendations to promote healthy sleep in all patients, especially if symptoms of insomnia are worsening. Due to the nature of sleep rhythms, if your body gets "out of rhythm", it may take some time before your sleep cycle can be "reset".  Please try to follow as many of the following tips as you can, usually there are only a few of these are the primary cause of the problem.  ?To reset your sleep rhythm, go to bed and get up at the same time every day ?Sleep only long enough to feel rested and then get out of bed ?Do not try to force yourself to sleep. If you can't sleep, get out of bed and try again later.  ?Have coffee, tea, and other foods that have caffeine only in the morning ?Exercise several days a week, but not right before bed ?If you drink alcohol, avoid alcohol in the late afternoon, evening, and bedtime ?If you smoke, avoid smoking, especially in the evening  ?Avoid watching TV or looking at phones, computers, or reading devices ("e-books") that give off light at least 30 minutes before bed. This artificial light sends "awake signals" to your brain and can make it harder to fall asleep. ?Keep your bedroom dark, cool, quiet, and free of reminders of  other things that cause you stress ?Try your best to solve or at least address your problems before you go to bed   Please let me know how to proceed with the [facility] Return to Work form, if you need me to complete it stating a future date of 1 month or more when you plan to return that is fine, just let me know before we submit it  Please schedule a follow-up appointment with Dr. Parks Ranger in (if ready to return) 4 weeks for Anxiety/Depression follow-up, Return to work  If you have any other questions or concerns, please feel free to call the clinic or send a message through [place]. You may also schedule an earlier appointment if necessary.  Nobie Putnam, DO [place]

## 2016-10-05 NOTE — Progress Notes (Addendum)
Subjective:    Patient ID: Phillip Finley, male    DOB: 04/05/1971, 45 y.o.   MRN: [id number]  Phillip Finley is a 45 y.o. male presenting on 10/05/2016 for Follow-up; Anxiety (inproved ); and Depression (worst)   HPI   Chronic Depression, Anxiety / Insomnia: - Reported chronic history over >10 years with intermittent episodes of worsening depressed mood, previously managed by other psychiatry has been on variety of medications with mixed results (including Lexapro, Risperidone, and others). Most recent medication trial with Venlafaxine (07/2016) but did not tolerate this as it worsened his concerns with suicidal ideation, he has had these concerns in the past with suicidal plans but no attempts. Most recently with Venlafaxine he was at increased risk with suicidal ideation in 07/2016, and was seen by [facility] crisis stabilization, had therapy and then referred to Psychiatry at [facility]. He discontinued Venlafaxine after 2 weeks, now is only taking Ativan as needed 0.5mg  1-2 times a day for anxiety or panic attacks - He is scheduled to see a Psychiatrist at [facility]) on Monday 10/10/16 for further management given his complex history. He has already established with [facility] therapy with Cordella Register on 11/9 and next visit 11/30 - Today he presents in follow-up since he has remained out of work over past approx 2 months. He is employed at [employer] as a Administrator, and has not been able to work due to severe anxiety / depression with panic attacks affecting his ability to function properly at work, including difficulty focusing and symptoms with panic attacks (chest tightness, tremors, difficulty breathing) - Continues Ativan as above, does not take regularly. Also admits recent repeat trial on Lexapro only for 4 weeks but then stopped - He expresses anxiety is somewhat improved recently but depression is unchanged to worse, he feels overall "tired of dealing" with these  issues, and has low energy, not interested in leaving the house often - Admits poor sleep with insomnia, able to go to sleep only for few hours then wakes up and can't fall back asleep, he has tried Trazodone in the past, and is not fond of sleep medications - Denies suicidal ideation or thoughts of harming others, racing or rapid thoughts, manic episode, worsening panic attack symptoms  Social History  Substance Use Topics  . Smoking status: Current Every Day Smoker    Packs/day: 2.00  . Smokeless tobacco: Never Used  . Alcohol use Yes     Comment: seldom    GAD 7 : Generalized Anxiety Score 08/29/2016 08/01/2016 07/12/2016 06/29/2016  Nervous, Anxious, on Edge 3 3 3 3   Control/stop worrying 2 3 3 2   Worry too much - different things 2 2 2 3   Trouble relaxing 2 3 3 2   Restless 2 2 2 3   Easily annoyed or irritable 1 1 2 1   Afraid - awful might happen 0 2 3 2   Total GAD 7 Score 12 16 18 16   Anxiety Difficulty Not difficult at all Not difficult at all Somewhat difficult Somewhat difficult    Depression screen [facility] 2/9 10/05/2016 08/29/2016 08/01/2016  Decreased Interest 3 3 3   Down, Depressed, Hopeless 3 2 2   PHQ - 2 Score 6 5 5   Altered sleeping 3 3 3   Tired, decreased energy 3 3 3   Change in appetite 1 2 1   Feeling bad or failure about yourself  2 - 2  Trouble concentrating 2 1 3   Moving slowly or fidgety/restless 1 2 3   Suicidal  thoughts 3 3 2   PHQ-9 Score 21 19 22   Difficult doing work/chores - - Somewhat difficult      Review of Systems Per HPI unless specifically indicated above     Objective:    BP (!) 143/83   Pulse 78   Temp 97.4 F (36.3 C) (Oral)   Resp 16   Ht 5\' 10"  (1.778 m)   Wt 222 lb 6.4 oz (100.9 kg)   BMI 31.91 kg/m   Wt Readings from Last 3 Encounters:  10/05/16 222 lb 6.4 oz (100.9 kg)  08/29/16 218 lb (98.9 kg)  08/04/16 213 lb 8 oz (96.8 kg)    Physical Exam  Constitutional: He appears well-developed and well-nourished. No distress.    Well-appearing, comfortable, cooperative  HENT:  Head: Normocephalic and atraumatic.  Mouth/Throat: Oropharynx is clear and moist.  Cardiovascular: Normal rate.   Pulmonary/Chest: Effort normal.  Neurological: He is alert.  Skin: Skin is warm and dry. He is not diaphoretic.  Psychiatric: He has a normal mood and affect. His behavior is normal.  Well groomed, good eye contact, does not appear nervous, normal speech and thought, no abnormal behavior or movements  Nursing note and vitals reviewed.      Assessment & Plan:   Problem List Items Addressed This Visit    Severe recurrent major depression ([place]) - Primary    Chronic problems with severe MDD with co morbid anxiety and associated insomnia, episodic worsening over past >10 years. Multiple medication failures in past from variety of classes SSRI (Lexapro, Zoloft), SNRI (Venlafaxine), atypical antipsych (Risperidone), Lamictal, also has tried Trazodone. - Anxiety seems improved, on Ativan PRN - Depression uncontrolled currently - Currently without any suicidal ideation, he was recently stabilized by crisis management and now has therapy, awaiting to see Psychiatry at [facility] (Dr Elvin So) 10/11/16 for further med management  Plan: 1. Discussion today regarding prior history and future plans, advised that we would not start any new medication today for depression given his chronic history of multiple med failures and upcoming Psychiatry appointment on 10/11/16. He understands and will establish with Psych for further management and continue therapy in addition to medications 2. He is not ready to return to work at this time, as he is currently receiving treatment and it is undetermined when he will be ready to return to work. He is eager to get back if his symptoms improve. 3. Regarding insomnia, treat underlying anxiety/depression, may take melatonin as needed but unlikely to solve the problem 4. Follow-up 4 week to re-evaluate status  of anxiety/depression after psychiatry management, and determine if ready to set date for return to work      Insomnia due to medical condition    Secondary to significant depression and anxiety, currently uncontrolled. - Treat underlying problem first - handout for improving sleep hygiene, may take melatonin as needed      Anxiety disorder    See A&P major depression         No orders of the defined types were placed in this encounter.     Follow up plan: Return in about 4 weeks (around 11/02/2016) for if ready to return) 4 weeks for Anxiety/Depression follow-up, Return to work.  Nobie Putnam, [facility] 10/06/2016, 1:41 PM

## 2016-10-06 NOTE — Assessment & Plan Note (Addendum)
Chronic problems with severe MDD with co morbid anxiety and associated insomnia, episodic worsening over past >10 years. Multiple medication failures in past from variety of classes SSRI (Lexapro, Zoloft), SNRI (Venlafaxine), atypical antipsych (Risperidone), Lamictal, also has tried Trazodone. - Anxiety seems improved, on Ativan PRN - Depression uncontrolled currently - Currently without any suicidal ideation, he was recently stabilized by crisis management and now has therapy, awaiting to see Psychiatry at [facility] (Dr Elvin So) 10/11/16 for further med management  Plan: 1. Discussion today regarding prior history and future plans, advised that we would not start any new medication today for depression given his chronic history of multiple med failures and upcoming Psychiatry appointment on 10/11/16. He understands and will establish with Psych for further management and continue therapy in addition to medications 2. He is not ready to return to work at this time, as he is currently receiving treatment and it is undetermined when he will be ready to return to work. He is eager to get back if his symptoms improve. 3. Regarding insomnia, treat underlying anxiety/depression, may take melatonin as needed but unlikely to solve the problem 4. Follow-up 4 week to re-evaluate status of anxiety/depression after psychiatry management, and determine if ready to set date for return to work

## 2016-10-10 ENCOUNTER — Encounter: Admission: RE | Payer: Self-pay | Source: Ambulatory Visit

## 2016-10-10 ENCOUNTER — Ambulatory Visit
Admission: RE | Admit: 2016-10-10 | Payer: BLUE CROSS/BLUE SHIELD | Source: Ambulatory Visit | Admitting: Gastroenterology

## 2016-10-10 SURGERY — ESOPHAGOGASTRODUODENOSCOPY (EGD) WITH PROPOFOL
Anesthesia: General

## 2016-10-11 ENCOUNTER — Encounter: Payer: Self-pay | Admitting: Psychiatry

## 2016-10-11 ENCOUNTER — Ambulatory Visit: Payer: BLUE CROSS/BLUE SHIELD | Admitting: Psychiatry

## 2016-10-11 ENCOUNTER — Encounter: Payer: Self-pay | Admitting: Family Medicine

## 2016-10-11 VITALS — BP 108/70 | HR 82 | Ht 70.0 in | Wt 225.0 lb

## 2016-10-11 DIAGNOSIS — F332 Major depressive disorder, recurrent severe without psychotic features: Secondary | ICD-10-CM

## 2016-10-11 DIAGNOSIS — F411 Generalized anxiety disorder: Secondary | ICD-10-CM

## 2016-10-11 MED ORDER — SERTRALINE HCL 25 MG PO TABS: 25.0000 mg | ORAL_TABLET | Freq: Every day | ORAL | 2 refills | Status: DC

## 2016-10-11 MED ORDER — TRAZODONE HCL 50 MG PO TABS: 50.0000 mg | ORAL_TABLET | Freq: Every day | ORAL | 1 refills | Status: DC
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
## 2016-10-20 ENCOUNTER — Ambulatory Visit (INDEPENDENT_AMBULATORY_CARE_PROVIDER_SITE_OTHER): Payer: BLUE CROSS/BLUE SHIELD | Admitting: Psychiatry

## 2016-10-20 ENCOUNTER — Ambulatory Visit (INDEPENDENT_AMBULATORY_CARE_PROVIDER_SITE_OTHER): Payer: BLUE CROSS/BLUE SHIELD | Admitting: Licensed Clinical Social Worker

## 2016-10-20 ENCOUNTER — Encounter: Payer: Self-pay | Admitting: Psychiatry

## 2016-10-20 VITALS — BP 125/79 | HR 73 | Temp 98.0°F | Resp 18 | Wt 225.0 lb

## 2016-10-20 DIAGNOSIS — F411 Generalized anxiety disorder: Secondary | ICD-10-CM

## 2016-10-20 DIAGNOSIS — F332 Major depressive disorder, recurrent severe without psychotic features: Secondary | ICD-10-CM

## 2016-10-20 MED ORDER — SERTRALINE HCL 50 MG PO TABS: 50.0000 mg | ORAL_TABLET | Freq: Every day | ORAL | 1 refills | Status: DC
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
## 2016-10-20 NOTE — Progress Notes (Signed)
Psychiatric progress note  Patient Identification: Phillip Finley MRN:  [id number] Date of Evaluation:  10/20/2016 Referral Source: Cordella Register Chief Complaint:    Visit Diagnosis:    ICD-9-CM ICD-10-CM   1. Severe episode of recurrent major depressive disorder, without psychotic features ([place]) 296.33 F33.2   2. GAD (generalized anxiety disorder) 300.02 F41.1     History of Present Illness:  Patient is a 45 year old Caucasian male with a long history of depression seen for a follow up. States he is tolerating the zoloft well, feeling more optimistic, not feeling a sense of dread. Patient reports that he continues to have some vague suicidal thoughts but without a plan and it is much better than before. Reports that he feels lighter and he was able to go out yesterday and run some errands. States that he has not been able to sleep on the trazodone him and that he increase the dosage to 100 mg. He gets about 3 hours sleep. States that the melatonin helps him better.  Past Psychiatric History:   Previous Psychotropic Medications: Yes Lexapro- helped with panic somewhat.  Substance Abuse History in the last 12 months:  No.  Consequences of Substance Abuse: Negative  Past Medical History:  Past Medical History:  Diagnosis Date  . Anxiety   . Depression   . GERD (gastroesophageal reflux disease)     Past Surgical History:  Procedure Laterality Date  . NO PAST SURGERIES      Family Psychiatric History: Moth  Family History:  Family History  Problem Relation Age of Onset  . Heart disease Father   . Heart attack Father 61  . Hyperlipidemia Mother   . Hypertension Mother     Social History:   Social History   Social History  . Marital status: Single    Spouse name: N/A  . Number of children: N/A  . Years of education: N/A   Social History Main Topics  . Smoking status: Current Every Day Smoker    Packs/day: 2.00    Years: 30.00  . Smokeless tobacco: Never Used  .  Alcohol use Yes     Comment: seldom  . Drug use: No  . Sexual activity: Yes    Partners: Female    Birth control/ protection: None   Other Topics Concern  . Not on file   Social History Narrative  . No narrative on file    Additional Social History:   Allergies:  No Known Allergies  Metabolic Disorder Labs: Lab Results  Component Value Date   HGBA1C 5.6 07/12/2016   No results found for: PROLACTIN Lab Results  Component Value Date   CHOL 209 (H) 06/29/2016   TRIG 194 (H) 06/29/2016   HDL 39 (L) 06/29/2016   CHOLHDL 5.4 (H) 06/29/2016   VLDL 39 (H) 06/29/2016   LDLCALC 131 (H) 06/29/2016     Current Medications: Current Outpatient Prescriptions  Medication Sig Dispense Refill  . omeprazole (PRILOSEC) 20 MG capsule Take 1 capsule (20 mg total) by mouth daily. 90 capsule 3  . sertraline (ZOLOFT) 25 MG tablet Take 1 tablet (25 mg total) by mouth daily. 30 tablet 2  . traZODone (DESYREL) 50 MG tablet Take 1 tablet (50 mg total) by mouth at bedtime. 30 tablet 1  . venlafaxine XR (EFFEXOR-XR) 75 MG 24 hr capsule Take 1 capsule (75 mg total) by mouth daily with breakfast. (Patient not taking: Reported on 10/11/2016) 30 capsule 11   No current facility-administered medications for this visit.  Neurologic: Headache: No Seizure: No Paresthesias:No  Musculoskeletal: Strength & Muscle Tone: within normal limits Gait & Station: normal Patient leans: N/A  Psychiatric Specialty Exam: ROS  There were no vitals taken for this visit.There is no height or weight on file to calculate BMI.  General Appearance: Casual  Eye Contact:  Fair  Speech:  Clear and Coherent  Volume:  Normal  Mood:  Slightly better  Affect:  Better, smiling today  Thought Process:  Coherent  Orientation:  Full (Time, Place, and Person)  Thought Content:  WDL  Suicidal Thoughts:  Yes.  without intent/plan  Homicidal Thoughts:  No  Memory:  Immediate;   Fair Recent;   Fair Remote;   Fair   Judgement:  Fair  Insight:  Fair  Psychomotor Activity:  Normal  Concentration:  Concentration: Fair and Attention Span: Fair  Recall:  [employer] of Knowledge:Fair  Language: Fair  Akathisia:  No  Handed:  Right  AIMS (if indicated):  na  Assets:  Communication Skills Desire for Improvement Financial Resources/Insurance Housing Physical Health  ADL's:  Intact  Cognition: WNL  Sleep:  Slightly better with melatonin    Treatment Plan Summary:  Major depressive disorder severe episode without psychotic features  Increase Zoloft to 50mg  po qd. Discussed the importance of exercise to help regulate mood and anxiety.  Insomnia Discontinue the trazodone Take Melatonon as needed. Discussed several sleep hygiene strategies with patient  Return to clinic in 2 week's time or call before if needed      Elvin So, MD 11/30/20172:05 PM

## 2016-11-01 ENCOUNTER — Ambulatory Visit: Payer: Self-pay | Admitting: Psychiatry

## 2016-11-02 ENCOUNTER — Ambulatory Visit: Payer: Self-pay | Admitting: Family Medicine

## 2016-11-09 ENCOUNTER — Ambulatory Visit: Payer: Self-pay | Admitting: Family Medicine

## 2016-11-09 ENCOUNTER — Encounter: Payer: Self-pay | Admitting: Family Medicine

## 2016-11-09 ENCOUNTER — Ambulatory Visit (INDEPENDENT_AMBULATORY_CARE_PROVIDER_SITE_OTHER): Payer: BLUE CROSS/BLUE SHIELD | Admitting: Family Medicine

## 2016-11-09 VITALS — BP 136/80 | HR 75 | Temp 98.1°F | Resp 16 | Ht 70.0 in | Wt 231.0 lb

## 2016-11-09 DIAGNOSIS — F411 Generalized anxiety disorder: Secondary | ICD-10-CM

## 2016-11-09 DIAGNOSIS — F332 Major depressive disorder, recurrent severe without psychotic features: Secondary | ICD-10-CM

## 2016-11-09 MED ORDER — TRAZODONE HCL 100 MG PO TABS: 100.0000 mg | ORAL_TABLET | Freq: Every day | ORAL | 2 refills | Status: DC

## 2016-11-09 MED ORDER — SERTRALINE HCL 50 MG PO TABS: 50.0000 mg | ORAL_TABLET | Freq: Every day | ORAL | 2 refills | Status: DC

## 2016-11-09 NOTE — Assessment & Plan Note (Addendum)
Improved on Sertraline 50mg  daily, still consistent with suspected GAD related to depression See A&P major depression

## 2016-11-09 NOTE — Assessment & Plan Note (Signed)
Significant interval improvement over past 1-2 months, currently improved on Sertraline 50mg  daily, consistent with severe MDD with co morbid anxiety (GAD) and associated insomnia. - Improved PHQ/GAD scores - Followed closely by [facility]iat[facility] Dr Einar Grad and Cordella Register counselor/therapist - No further suicidal ideation or thoughts  Plan: 1. Continue current treatment with Sertraline 50mg  daily, he is due for refill, proceed with renew rx, #30 pills, +2 refills, anticipate he will need to continue Sertraline for 6-12 months maybe more long time, in future can certainly consider increased dose 2. Given still poorly controlled sleep maintenance / insomnia, increase Trazodone to 100mg , after discussion with patient to try more regular at this dose for period of time to see if improved, he only tried double dose from before x 1 night, can continue melatonin, reviewed sleep hygiene 3. Follow-up as scheduled with [facility] / Counseling 4. He is medically and mentally cleared / stable to return to work regular duty without restrictions on or after 11/21/2016, letter written and signed, given to patient today, will follow-up any other paperwork as needed, also may need input from Psychiatry 4. Follow-up 6 weeks to 3 months as needed, next visit likely with psychiatry

## 2016-11-09 NOTE — Patient Instructions (Signed)
Thank you for coming in to clinic today.  1.  - Re-schedule with [facility] Psychiatry Dr Einar Grad / and Therapy Counseling  - Increased Trazodone to 100mg  nightly, for sleep - Continue Zoloft 50mg  daily, refills of both sent  You may need Protonix (Pantoprazole) for acid reflux if the nexium or omeprazole are not covered  Note written for return to work on or after 11/2016  Follow-up with Dr Parks Ranger anytime in next 1 to 3 months for Anxiety / Depression / GAD/PHQ  If you have any other questions or concerns, please feel free to call the clinic or send a message through [place]. You may also schedule an earlier appointment if necessary.  Nobie Putnam, DO [place]

## 2016-11-09 NOTE — Progress Notes (Signed)
Subjective:    Patient ID: Phillip Finley, male    DOB: October 31, 1971, 45 y.o.   MRN: [id number]  Phillip Finley is a 45 y.o. male presenting on 11/09/2016 for Anxiety (improved a little seeing psychiatrist)   HPI   Chronic Depression, Anxiety / Insomnia: - Last appointment with me for anxiety/depression on 10/05/16, he has seen Psychiatry ([facility], Dr Einar Grad) twice since then, on 10/11/16 and 10/20/16, additionally continues to see [facility] counselor for therapy with Cordella Register. He was increased on Zoloft from 25mg  to 50mg  last visit. - Today he reports overall doing much better with regards to his mood and anxiety. See below scores of GAD7, PHQ9, overall his scores are still elevated, but somewhat improved, he feels like he still has a lot of the symptoms including depressed mood and anxiety on a daily basis, but they are much less severe, and therefore difficulty with the questionnaires. He states his mood is improved overall about 60% better and he is more optimistic about his recovery. Still admits hard time with motivation and decreased energy, admits some procrastination. - Primary concern of his is mostly the sleep and insomnia, now much improved with initial ability to fall asleep, but he has a hard time with sleep maintenance, frequently waking up and difficulty falling back asleep, he will lay in bed for while can't fall asleep, or sometimes will get up and has tried drinking decaf coffee. - Improved on Sertraline 50mg  daily overall. He has tried Trazodone 50mg  nightly without significant relief, he tried x 2 tablets once unsure if helped, does take melatonin as needed with some relief - New stress with close friend passed away (from significant illness, complicated diabetes) 1 week ago, he is able to come to terms with this and is handling it well - Today he reports that he believes he is on target to return back to work starting 11/21/2016 on regular schedule, he feels mentally ready to return - Admits  some weight gain +5 lbs, less exercise recently, and his portion size of meals has reduced but has normal appetite - Admits some increased libido - he has to reschedule last apt with Dr Einar Grad Psychiatry due to insurance issue but plans to closely follow-up - Denies suicidal ideation or thoughts of harming others, racing or rapid thoughts, manic episode, worsening panic attack symptoms  Social History  Substance Use Topics  . Smoking status: Current Every Day Smoker    Packs/day: 2.00    Years: 30.00  . Smokeless tobacco: Never Used  . Alcohol use Yes     Comment: seldom    GAD 7 : Generalized Anxiety Score 11/09/2016 08/29/2016 08/01/2016 07/12/2016  Nervous, Anxious, on Edge 3 3 3 3   Control/stop worrying 1 2 3 3   Worry too much - different things 1 2 2 2   Trouble relaxing 1 2 3 3   Restless 3 2 2 2   Easily annoyed or irritable 0 1 1 2   Afraid - awful might happen 1 0 2 3  Total GAD 7 Score 10 12 16 18   Anxiety Difficulty Not difficult at all Not difficult at all Not difficult at all Somewhat difficult    Depression screen [facility] 2/9 11/09/2016 10/05/2016 08/29/2016  Decreased Interest 3 3 3   Down, Depressed, Hopeless 3 3 2   PHQ - 2 Score 6 6 5   Altered sleeping 3 3 3   Tired, decreased energy 3 3 3   Change in appetite 0 1 2  Feeling bad or failure about yourself  1 2 -  Trouble concentrating 1 2 1   Moving slowly or fidgety/restless 1 1 2   Suicidal thoughts 0 3 3  PHQ-9 Score 15 21 19   Difficult doing work/chores Somewhat difficult - -      Review of Systems Per HPI unless specifically indicated above     Objective:    BP 136/80   Pulse 75   Temp 98.1 F (36.7 C) (Oral)   Resp 16   Ht 5\' 10"  (1.778 m)   Wt 231 lb (104.8 kg)   BMI 33.15 kg/m   Wt Readings from Last 3 Encounters:  11/09/16 231 lb (104.8 kg)  10/20/16 225 lb (102.1 kg)  10/11/16 225 lb (102.1 kg)    Physical Exam  Constitutional: He appears well-developed and well-nourished. No distress.    Well-appearing, comfortable, cooperative  HENT:  Head: Normocephalic and atraumatic.  Mouth/Throat: Oropharynx is clear and moist.  Cardiovascular: Normal rate.   Pulmonary/Chest: Effort normal.  Neurological: He is alert.  Skin: Skin is warm and dry. He is not diaphoretic.  Psychiatric: He has a normal mood and affect. His behavior is normal.  Well groomed, good eye contact, mood appears much improved more positive and animated and conversational, does not appear anxious, normal speech and thought  Nursing note and vitals reviewed.      Assessment & Plan:   Problem List Items Addressed This Visit    Severe recurrent major depression ([place]) - Primary    Significant interval improvement over past 1-2 months, currently improved on Sertraline 50mg  daily, consistent with severe MDD with co morbid anxiety (GAD) and associated insomnia. - Improved PHQ/GAD scores - Followed closely by [facility]iat[facility] Dr Einar Grad and Cordella Register counselor/therapist - No further suicidal ideation or thoughts  Plan: 1. Continue current treatment with Sertraline 50mg  daily, he is due for refill, proceed with renew rx, #30 pills, +2 refills, anticipate he will need to continue Sertraline for 6-12 months maybe more long time, in future can certainly consider increased dose 2. Given still poorly controlled sleep maintenance / insomnia, increase Trazodone to 100mg , after discussion with patient to try more regular at this dose for period of time to see if improved, he only tried double dose from before x 1 night, can continue melatonin, reviewed sleep hygiene 3. Follow-up as scheduled with [facility] / Counseling 4. He is medically and mentally cleared / stable to return to work regular duty without restrictions on or after 11/21/2016, letter written and signed, given to patient today, will follow-up any other paperwork as needed, also may need input from Psychiatry 4. Follow-up 6 weeks to 3 months as needed, next visit  likely with psychiatry      Relevant Medications   traZODone (DESYREL) 100 MG tablet   sertraline (ZOLOFT) 50 MG tablet   Anxiety disorder    Improved on Sertraline 50mg  daily, still consistent with suspected GAD related to depression See A&P major depression      Relevant Medications   sertraline (ZOLOFT) 50 MG tablet      Meds ordered this encounter  Medications  . DISCONTD: traZODone (DESYREL) 50 MG tablet  . esomeprazole (NEXIUM) 20 MG packet    Sig: Take 20 mg by mouth daily before breakfast.  . traZODone (DESYREL) 100 MG tablet    Sig: Take 1 tablet (100 mg total) by mouth at bedtime.    Dispense:  30 tablet    Refill:  2  . sertraline (ZOLOFT) 50 MG tablet  Sig: Take 1 tablet (50 mg total) by mouth daily.    Dispense:  30 tablet    Refill:  2      Follow up plan: Return in about 6 weeks (around 12/21/2016) for Anxiety, Depression, Insomnia.  Nobie Putnam, [facility] 11/09/2016, 11:02 AM

## 2016-11-16 ENCOUNTER — Encounter: Payer: Self-pay | Admitting: Family Medicine

## 2016-11-17 ENCOUNTER — Telehealth: Payer: Self-pay | Admitting: Family Medicine

## 2016-11-17 NOTE — Telephone Encounter (Signed)
Last visit with me 11/09/16, agreed on mutual plan to return back to work on 11/21/16, letters were written and given to patient at that time. Now I received fax from [place] for Return To Work Form.  Called patient to review this form today 11/17/16 (delay since we were closed for holiday recently), also I had sent patient mychart message yesterday 12/27 but it had not been reviewed so I called patient today.  Patient agrees with plan to return to work 11/21/16, this will be indicated on form that is he medically cleared to return without restrictions, this form will be dated and signed today 11/17/16.  However, the form as a Part A section to be completed by patient, requires leave start date 07/05/16, and expected return to work date 11/21/16. The form requires his signature and other demographics.  I asked patient about this and he is unsure if he is able to submit his portion online or if he needs to come by office to sign it prior to submitting form (either patient may submit online) or we may fax to [phone].  He will call us back later today or tomorrow to determine how to proceed, advised that the completed form from my stand point will be available at front office, and needs to be submitted no later than tomorrow 11/18/16.  Nobie Putnam, DO [facility] 11/17/2016, 3:03 PM

## 2016-11-18 ENCOUNTER — Encounter: Payer: Self-pay | Admitting: Family Medicine

## 2016-11-22 ENCOUNTER — Encounter: Payer: Self-pay | Admitting: Family Medicine

## 2016-11-22 ENCOUNTER — Telehealth: Payer: Self-pay | Admitting: Family Medicine

## 2016-11-22 ENCOUNTER — Encounter: Payer: Self-pay | Admitting: *Deleted

## 2016-11-22 DIAGNOSIS — F332 Major depressive disorder, recurrent severe without psychotic features: Secondary | ICD-10-CM

## 2016-11-22 DIAGNOSIS — F411 Generalized anxiety disorder: Secondary | ICD-10-CM

## 2016-11-22 MED ORDER — SERTRALINE HCL 50 MG PO TABS: 75.0000 mg | ORAL_TABLET | Freq: Every day | ORAL | 2 refills | Status: DC

## 2016-11-22 NOTE — Telephone Encounter (Signed)
Pt.wife called states that pt. could not get an appt.until  Jan 26 to maybe  have medication  changed,wanted to know what  He should do have requested that you call the  Wife at  [phone]

## 2016-11-22 NOTE — Telephone Encounter (Signed)
Recent discussion via MyChart message with patient regarding his return to work and recent worsening depression/anxiety, he is interested in increased Zoloft dosing up from 50mg  daily, he had been doing better, I advised him to follow-up closely with Psychiatry already established with Dr Einar Grad for dosage adjustment and follow-up since his last visit with me he was doing better and requested return to work. Now since unable to get apt with Dr Einar Grad Psychiatry until 12/16/16, I agree with increase dose Zoloft 75mg  daily, 1.5 tablets daily at same time, can re-evaluate this dose change on 12/16/16 may need 100mg  or other change.  New rx sent to pharmacy, does not come in 75mg  tablets.  Please notify patient of rx change and follow-up plan.  Nobie Putnam, DO [facility] 11/22/2016, 12:20 PM

## 2016-12-01 ENCOUNTER — Ambulatory Visit (INDEPENDENT_AMBULATORY_CARE_PROVIDER_SITE_OTHER): Payer: BLUE CROSS/BLUE SHIELD | Admitting: Licensed Clinical Social Worker

## 2016-12-01 DIAGNOSIS — F411 Generalized anxiety disorder: Secondary | ICD-10-CM

## 2016-12-01 DIAGNOSIS — F332 Major depressive disorder, recurrent severe without psychotic features: Secondary | ICD-10-CM

## 2016-12-01 NOTE — Progress Notes (Signed)
   THERAPIST PROGRESS NOTE  Session Time: 2 PM to 2:55 PM  Participation Level: Active  Behavioral Response: CasualAlertDysphoric  Type of Therapy: Individual Therapy  Treatment Goals addressed:  elevate mood, decrease anxiety and panic attacks and to have a sense of well-being  Interventions: CBT, Solution Focused, Strength-based, Supportive and Reframing  Summary: Phillip Finley, who likes the name Phillip Finley, is a 46 y.o. male who presents worsening mood. He relates that his hopes were up with the move but now he's gone back to where he was after moving into new house. Described feeling to therapist as trouble with sleeping. He doesn't stay asleep because his mind keeps going. Relates feeling tired during the day and medication also contributes to tired feeling during the day. Describes some problems with vision. Relates during the day his mind is racing and gets analytical and indecisive about small things. He sets projects but other things get him side tracked so he doesn't start on projects. Described passive SI and reminisces and nostalgic about when he was younger and better times. Endorses importance of his family and relationship with his shared. Shared some of the struggles of parenting but also described a good relationship with kids. Described snowmobiling in the past hobby and really feeling happy about ten years ago.    Suicidal/Homicidal:  Has some passive SI at times but no active plan or intent  Therapist Response:  Encouraged patient to pay attention to what he enjoys now and identify things that he values in his life to help in improving mood. Challenged patient on accuracy of his perspective and how all of [place] have distortions in perspective as none of [place] can see the whole picture. As patient talked about positive experiences from the past therapist related that he will again have positive experiences. Challenged patient on perfectionists thinking and when caught in  thoughts that is  more effective to go into action mode to cope. Explained that none of [place] ever reach perfection and learn to accept that. Discussed how starting a project helps [place] build motivation. Encouraged patient to focus on some of the positive experiences he can have now. Help patient process his feelings to help manage better and provided supportive and strength-based interventions.   Plan: Return again in 2 weeks.2.Review Growth Mindset video on youtube to challenge perfectionistic thinking.3. Patient gain insight to effective strategies to manage emotions and stressors and implement him to life situations  Diagnosis: Axis I:  major depressive disorder, recurrent, severe, generalized anxiety disorder    Axis II: No diagnosis    Bowman,Mary A, LCSW 12/01/2016

## 2016-12-09 ENCOUNTER — Encounter: Payer: Self-pay | Admitting: Family Medicine

## 2016-12-12 ENCOUNTER — Encounter: Payer: Self-pay | Admitting: Family Medicine

## 2016-12-12 NOTE — Telephone Encounter (Signed)
Already responded to this in most recent [employer]. Letter written to be faxed to [facility].  Nobie Putnam, [facility] 12/12/2016, 4:34 PM

## 2016-12-15 ENCOUNTER — Ambulatory Visit (INDEPENDENT_AMBULATORY_CARE_PROVIDER_SITE_OTHER): Payer: BLUE CROSS/BLUE SHIELD | Admitting: Licensed Clinical Social Worker

## 2016-12-15 DIAGNOSIS — F332 Major depressive disorder, recurrent severe without psychotic features: Secondary | ICD-10-CM | POA: Diagnosis not present

## 2016-12-15 DIAGNOSIS — F411 Generalized anxiety disorder: Secondary | ICD-10-CM

## 2016-12-15 NOTE — Progress Notes (Signed)
   THERAPIST PROGRESS NOTE  Session Time: 1 PM to 1:55 PM  Participation Level: Active  Behavioral Response: CasualAlertEuthymic  Type of Therapy: Individual Therapy  Treatment Goals addressed:   elevate mood, decrease anxiety and panic attacks and to have a sense of well-being Interventions: CBT, DBT, Solution Focused, Strength-based, Supportive and Reframing  Summary: Phillip Finley is a 46 y.o. male who presents with mood about the same. He relates that he has an issue with his son who is in middle school and doesn't want to go to school. He is going to a new school as they just moved and believes he has depression issues and talk to patient. He will take him to see someone if this continues. He has pushed himself to do some chores but still is being analytical and things have to be perfect. He reflects that in the past he has accomplished a lot more than he does now. Late that his wife is supportive. His PCP increased dosage of Zoloft because the doctor thought it was low but wants patient to continue to be the psychiatrists to manage medications. Discussed his preoccupation with ways to die but denies he is suicidal. He said a person has to realize that her life is not just about them it is about your friends, and your family. His doctor increased his Zoloft but wants him to be monitored for meds by a psychiatrist. He has trouble sleeping and a lot of anxiety at night so will review meds so we'll discuss meds. Patient related having weird dreams and reviewed session and patient related that he wants to look more into dream interpretation. Suicidal/Homicidal: No  Therapist Response: Reviewed symptoms and encouraged patient to challenge negative cognitions and reframe with something positive. Help patient process feelings, discussed stressors and problem solved around stressors. Continue to challenge patient on perfectionistic thinking and pointed out that nothing is perfect in life. Challenged  patient on thought distortion of permanence and thinking that things always be the same. One of the truths in life is that things are always changing. Encouraged patient to identify things in his life that bring joy and  gave examples and encouraged him to seek those moments out. Continue to have patient focused on what is important to him. Discussed being present focused and paying attention to one's activity is mindfulness and is  way to help manage negative thoughts. Discussed dreams and that symbols are the language a dream and they are a good ways to learn about the the unconscious and ourself. Provided positive feedback efforts to complete tasks and perspective and realizing how his life impacts others hat is helpful in encouraging helpful coping. Provided supportive and strength-based interventions. Discussed patient going for medication management session to address taking mad at night and problems with sleep. Plan: Return again in 2 weeks.2. Patient continue to work with therapists on gaining insight to emotional regulation strategies to help in improving mood and anxiety.3. Asian scheduled to see psychiatrist for medication management session.  Diagnosis: Axis I:  major depressive disorder, recurrent, severe, generalized anxiety disorder    Axis II: No diagnosis    Pier Laux A, LCSW 12/15/2016

## 2016-12-16 ENCOUNTER — Encounter: Payer: Self-pay | Admitting: Family Medicine

## 2016-12-20 ENCOUNTER — Ambulatory Visit: Payer: BLUE CROSS/BLUE SHIELD | Admitting: Psychiatry

## 2016-12-21 ENCOUNTER — Encounter: Payer: Self-pay | Admitting: Family Medicine

## 2016-12-30 ENCOUNTER — Encounter: Payer: Self-pay | Admitting: Family Medicine

## 2016-12-30 ENCOUNTER — Ambulatory Visit (INDEPENDENT_AMBULATORY_CARE_PROVIDER_SITE_OTHER): Payer: BLUE CROSS/BLUE SHIELD | Admitting: Family Medicine

## 2016-12-30 VITALS — BP 114/70 | HR 63 | Temp 97.8°F | Resp 16 | Ht 70.0 in | Wt 227.0 lb

## 2016-12-30 DIAGNOSIS — F411 Generalized anxiety disorder: Secondary | ICD-10-CM

## 2016-12-30 DIAGNOSIS — F332 Major depressive disorder, recurrent severe without psychotic features: Secondary | ICD-10-CM | POA: Diagnosis not present

## 2016-12-30 DIAGNOSIS — G4701 Insomnia due to medical condition: Secondary | ICD-10-CM | POA: Diagnosis not present

## 2016-12-30 MED ORDER — SERTRALINE HCL 50 MG PO TABS: 75.0000 mg | ORAL_TABLET | Freq: Every day | ORAL | 5 refills | Status: DC

## 2016-12-30 MED ORDER — MIRTAZAPINE 15 MG PO TABS: 15.0000 mg | ORAL_TABLET | Freq: Every day | ORAL | 5 refills | Status: DC

## 2016-12-30 NOTE — Patient Instructions (Addendum)
Thank you for coming in to clinic today.  1.  - Re-schedule with [facility] Psychiatry Dr Einar Grad / and Therapy Counseling  - STOP Trazodone - Refilled Zoloft 50mg  tablets, continue taking 1.5 tabs for dose 75mg  daily, you have refills for up to 6 months  - Start new Mirtazapine (Remeron) 15mg  nightly for insomnia and depression. As discussed you may have an increased appetite and some weight gain associated with this medication. There aren't too many side effects otherwise.  If concerned about Toenails can see [facility] Address: [address] [place], [place] [address] Hours: Open 8AM-5PM Phone: [phone]  [facility] [place], [place] [address] Hours - M-F 8-5 Phone: [phone] phone  Note today for return to work 01/05/17  You will be due for [place] (no food or drink after midnight before, only water or coffee without cream/sugar on the morning of)  - Please go ahead and schedule a "Lab Only" visit in the morning at the clinic for lab draw in 6 months AFTER 06/29/17 - Make sure Lab Only appointment is at least 1-2 weeks before your next appointment, so that results will be available  For Lab Results, once available within 2-3 days of blood draw, you can can log in to MyChart online to view your results and a brief explanation. Also, we can discuss results at next follow-up visit.  Follow-up with Dr Parks Ranger in 6 months in Annual Physical after 06/29/17 and follow-up anxiety/depression, GAD/PHQ or sooner within 3 months if needed for this issue  If you have any other questions or concerns, please feel free to call the clinic or send a message through [place]. You may also schedule an earlier appointment if necessary.  Nobie Putnam, DO [facility]

## 2016-12-30 NOTE — Progress Notes (Signed)
Subjective:    Patient ID: Phillip Finley, male    DOB: 1971/07/04, 46 y.o.   MRN: [id number]  Phillip Finley is a 46 y.o. male presenting on 12/30/2016 for Follow-up   HPI   FOLLOW-UP Chronic Depression, Anxiety / Insomnia: - Last office visit with me 11/09/16 for same issue, see note for details, have had several MyChart conversations with patient in interval to discuss return to work paperwork, also most recently by phone discussed dose increase of Zoloft on 11/22/16 from Zoloft 50mg  daily to 75mg  daily, taking 1.5 tabs daily for past 4 weeks now - His last apt with Psychiatry was scheduled for 12/16/16 with [facility] Dr Einar Grad, but this had to be re-scheduled due to Dr Einar Grad unavailable, and he is anticipating on re-scheduling this visit.  - Today he reports overall feels much better now, describes feeling of more "leveled out", feels more upbeat and energetic, and more motivated. He is more in control of anxiety and sad thoughts, and these are much less severe now. Denies any side effects or problems with Zoloft. - Still admits difficulty with sleep maintenance, he can fall asleep without problem but will wake up 3-4 hours later and has difficulty falling back asleep. Stopped taking Trazodone 100mg  at night due to not helping. - Interested in other medications for sleep - He continues with therapy, last visit 12/15/16, doing well and believes it is helping - Concern with recent life stressor within past 1-2 weeks, wife's mother passed away, they flew out to [place] for several days - He is ready to return to work on Thursday 01/05/17 as planned previously now, has no other concerns about return to work or paperwork today - Denies suicidal ideation or thoughts of harming others, racing or rapid thoughts, manic episode, worsening panic attack symptoms   Social History  Substance Use Topics  . Smoking status: Current Every Day Smoker    Packs/day: 2.00    Years: 30.00  . Smokeless tobacco: Never Used  .  Alcohol use Yes     Comment: seldom    GAD 7 : Generalized Anxiety Score 12/30/2016 11/09/2016 08/29/2016 08/01/2016  Nervous, Anxious, on Edge 2 3 3 3   Control/stop worrying 1 1 2 3   Worry too much - different things 2 1 2 2   Trouble relaxing 0 1 2 3   Restless 1 3 2 2   Easily annoyed or irritable 0 0 1 1  Afraid - awful might happen 1 1 0 2  Total GAD 7 Score 7 10 12 16   Anxiety Difficulty Not difficult at all Not difficult at all Not difficult at all Not difficult at all    Depression screen [facility] 2/9 12/30/2016 11/09/2016 10/05/2016  Decreased Interest 1 3 3   Down, Depressed, Hopeless 2 3 3   PHQ - 2 Score 3 6 6   Altered sleeping 3 3 3   Tired, decreased energy 1 3 3   Change in appetite 1 0 1  Feeling bad or failure about yourself  1 1 2   Trouble concentrating 0 1 2  Moving slowly or fidgety/restless 2 1 1   Suicidal thoughts 0 0 3  PHQ-9 Score 11 15 21   Difficult doing work/chores Somewhat difficult Somewhat difficult -      Review of Systems Per HPI unless specifically indicated above     Objective:    BP 114/70 (BP Location: Left Arm, Cuff Size: Normal)   Pulse 63   Temp 97.8 F (36.6 C) (Oral)   Resp 16   Ht  5\' 10"  (1.778 m)   Wt 227 lb (103 kg)   BMI 32.57 kg/m   Wt Readings from Last 3 Encounters:  12/30/16 227 lb (103 kg)  11/09/16 231 lb (104.8 kg)  10/05/16 222 lb 6.4 oz (100.9 kg)    Physical Exam  Constitutional: He appears well-developed and well-nourished. No distress.  Well-appearing, comfortable, cooperative  HENT:  Head: Normocephalic and atraumatic.  Mouth/Throat: Oropharynx is clear and moist.  Cardiovascular: Normal rate, regular rhythm, normal heart sounds and intact distal pulses.   No murmur heard. Pulmonary/Chest: Effort normal.  Neurological: He is alert.  Skin: Skin is warm and dry. He is not diaphoretic.  Psychiatric: He has a normal mood and affect. His behavior is normal.  Well groomed, good eye contact, mood continues to be  significantly improved more positive, does not appear anxious, normal speech and thought  Nursing note and vitals reviewed.      Assessment & Plan:   Problem List Items Addressed This Visit    Severe recurrent major depression ([place]) - Primary    Contiued significant interval improvement now 4 weeks after Sertraline dose increase from 50 to 75, overall course past several months consistent with severe MDD with co morbid anxiety (GAD) and associated insomnia. - Still improved PHQ/GAD scores - Followed closely by [facility] Psychiatry Dr Einar Grad and Cordella Register counselor/therapist - Denies any suicidal ideation or thoughts, does admit some thoughts of "concept of death" and discussed these with his therapist, but no concerns, aware when to seek more immediate help and who to call if needed  Plan: 1. Continue increased Sertraline 75mg  daily, refill sent for 50mg  tabs, take 1.5 for dose 75mg  daily, #45 pills +5 refills, continue for 6-12 months overall, continue re-evaluate, can discuss further with Psychiatry, if need future dose inc to 100mg  2. Discontinue Trazodone, ineffective. Start new Mirtazapine 15mg  nightly, #30, +5 refills, discussion on side effects with some increased appetite possible, continue sleep hygiene, may titrate dose as needed 3. Follow-up as scheduled with [facility]iatry / Counseling 4. He is medically and mentally cleared / stable to return to work regular duty without restrictions on 01/05/17, new doctors letter written today given to patient 5. Follow-up with me 6 months for Annual Physical, follow-up anxiety/depression/insomnia, otherwise in interval can follow-up with Psychiatry, he may return sooner if needed      Relevant Medications   mirtazapine (REMERON) 15 MG tablet   sertraline (ZOLOFT) 50 MG tablet   Insomnia due to medical condition    Secondary to significant depression and anxiety, currently improved but still difficult sleep maintenance.  Plan: 1. Continue to  treat underlying depression/anxiety with Sertraline now 75mg  daily 2. Discontinue Trazodone, ineffective. Start new Mirtazapine 15mg  nightly, #30, +5 refills, discussion on side effects with some increased appetite possible, continue sleep hygiene, may titrate dose as needed 3. Follow-up as scheduled with [facility] Psychiatry / Counseling      Relevant Medications   mirtazapine (REMERON) 15 MG tablet   Anxiety disorder    Improved on Sertraline 75mg  daily, consistent with suspected GAD related to depression - Improved GAD scores - Cleared to return to work 01/05/17 See A&P major depression      Relevant Medications   sertraline (ZOLOFT) 50 MG tablet      Meds ordered this encounter  Medications  . mirtazapine (REMERON) 15 MG tablet    Sig: Take 1 tablet (15 mg total) by mouth at bedtime.    Dispense:  30 tablet  Refill:  5  . sertraline (ZOLOFT) 50 MG tablet    Sig: Take 1.5 tablets (75 mg total) by mouth daily.    Dispense:  45 tablet    Refill:  5      Follow up plan: Return in about 6 months (around 06/29/2017) for Annual Physical.  Nobie Putnam, DO [facility] 12/30/2016, 12:35 PM

## 2016-12-30 NOTE — Assessment & Plan Note (Signed)
Contiued significant interval improvement now 4 weeks after Sertraline dose increase from 50 to 75, overall course past several months consistent with severe MDD with co morbid anxiety (GAD) and associated insomnia. - Still improved PHQ/GAD scores - Followed closely by [facility] Psychiatry Dr Einar Grad and Cordella Register counselor/therapist - Denies any suicidal ideation or thoughts, does admit some thoughts of "concept of death" and discussed these with his therapist, but no concerns, aware when to seek more immediate help and who to call if needed  Plan: 1. Continue increased Sertraline 75mg  daily, refill sent for 50mg  tabs, take 1.5 for dose 75mg  daily, #45 pills +5 refills, continue for 6-12 months overall, continue re-evaluate, can discuss further with Psychiatry, if need future dose inc to 100mg  2. Discontinue Trazodone, ineffective. Start new Mirtazapine 15mg  nightly, #30, +5 refills, discussion on side effects with some increased appetite possible, continue sleep hygiene, may titrate dose as needed 3. Follow-up as scheduled with [facility]iatry / Counseling 4. He is medically and mentally cleared / stable to return to work regular duty without restrictions on 01/05/17, new doctors letter written today given to patient 5. Follow-up with me 6 months for Annual Physical, follow-up anxiety/depression/insomnia, otherwise in interval can follow-up with Psychiatry, he may return sooner if needed

## 2016-12-30 NOTE — Assessment & Plan Note (Signed)
Secondary to significant depression and anxiety, currently improved but still difficult sleep maintenance.  Plan: 1. Continue to treat underlying depression/anxiety with Sertraline now 75mg  daily 2. Discontinue Trazodone, ineffective. Start new Mirtazapine 15mg  nightly, #30, +5 refills, discussion on side effects with some increased appetite possible, continue sleep hygiene, may titrate dose as needed 3. Follow-up as scheduled with [facility]iatry / Counseling

## 2016-12-30 NOTE — Assessment & Plan Note (Addendum)
Improved on Sertraline 75mg  daily, consistent with suspected GAD related to depression - Improved GAD scores - Cleared to return to work 01/05/17 See A&P major depression

## 2017-01-04 ENCOUNTER — Encounter: Payer: Self-pay | Admitting: Family Medicine

## 2017-01-05 ENCOUNTER — Encounter: Payer: Self-pay | Admitting: Family Medicine

## 2017-01-13 ENCOUNTER — Encounter: Payer: Self-pay | Admitting: Family Medicine

## 2017-01-17 ENCOUNTER — Telehealth: Payer: Self-pay

## 2017-01-17 NOTE — Telephone Encounter (Signed)
patient PCP called he would like to speak with you.  pt needs a return to work letter from you.  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  and if you would be willing to provide any additional imput.

## 2017-01-17 NOTE — Telephone Encounter (Signed)
Ok, will call tomorrow

## 2017-01-17 NOTE — Telephone Encounter (Signed)
I have not seen the patient in 4 months and he will need a follow-up appointment for me to determine if he is ready to go back to work. Please call patient and have him come for a follow-up appointment

## 2017-01-17 NOTE — Telephone Encounter (Signed)
Pt was called and left a message to call office to make an appt to be seen .  PCP was told that patient would need to be seen before a letter could be issued  pcp still wanted to speak with dr. Einar Grad.

## 2017-01-19 ENCOUNTER — Other Ambulatory Visit: Payer: Self-pay | Admitting: *Deleted

## 2017-01-19 ENCOUNTER — Telehealth: Payer: Self-pay

## 2017-01-19 NOTE — Telephone Encounter (Signed)
pt wife called states that he needs to be seen earlier then the 12th pt will lose his insurance and will not have any money coming in.  they needs something asap.  pt wife was told that there was nothing any earlier then the 13th and that the patient would have to be seen cause he has not been seen since nov.  But she states that it is because doctor was not in the office.

## 2017-01-20 NOTE — Telephone Encounter (Signed)
needs to speak with you about disability paperwork on patient please call [phone]

## 2017-01-23 NOTE — Telephone Encounter (Signed)
Please check with Lea about appointments, I am seeing 3 new patients daily, she can cancel one of them to make space for much needed follow ups.

## 2017-01-26 ENCOUNTER — Encounter: Payer: Self-pay | Admitting: Family Medicine

## 2017-01-30 ENCOUNTER — Ambulatory Visit: Payer: BLUE CROSS/BLUE SHIELD | Admitting: Psychiatry

## 2017-02-01 ENCOUNTER — Ambulatory Visit (INDEPENDENT_AMBULATORY_CARE_PROVIDER_SITE_OTHER): Payer: BLUE CROSS/BLUE SHIELD | Admitting: Psychiatry

## 2017-02-01 ENCOUNTER — Encounter: Payer: Self-pay | Admitting: Family Medicine

## 2017-02-01 ENCOUNTER — Encounter: Payer: Self-pay | Admitting: Psychiatry

## 2017-02-01 VITALS — BP 135/78 | HR 69 | Temp 97.6°F | Wt 227.0 lb

## 2017-02-01 DIAGNOSIS — F411 Generalized anxiety disorder: Secondary | ICD-10-CM

## 2017-02-01 DIAGNOSIS — F322 Major depressive disorder, single episode, severe without psychotic features: Secondary | ICD-10-CM | POA: Diagnosis not present

## 2017-02-01 NOTE — Progress Notes (Signed)
Psychiatric progress note  Patient Identification: Phillip Finley MRN:  [id number] Date of Evaluation:  02/01/2017 Referral Source: Cordella Register Chief Complaint:   Chief Complaint    Follow-up; Medication Refill     Visit Diagnosis:    ICD-9-CM ICD-10-CM   1. Major depressive disorder, single episode, severe (HCC) 296.23 F32.2   2. GAD (generalized anxiety disorder) 300.02 F41.1     History of Present Illness:  Patient is a 46 year old Caucasian male with a long history of depression seen for a follow up. Patient missed his appointments and is seen today for follow up. His Zoloft was increased to 75mg  by his primary care physician and reports doing well on it. He states that he was also started on Remeron for sleep and that made him too sedated. He has not been taking the Remeron. 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 We discussed that since the Remeron was started by his primary care physician he would need to obtain a letter from there. Patient reports doing well overall and not having any suicidal thoughts.  Past Psychiatric History:   Previous Psychotropic Medications: Yes Lexapro- helped with panic somewhat.  Substance Abuse History in the last 12 months:  No.  Consequences of Substance Abuse: Negative  Past Medical History:  Past Medical History:  Diagnosis Date  . Anxiety   . Depression   . GERD (gastroesophageal reflux disease)     Past Surgical History:  Procedure Laterality Date  . NO PAST SURGERIES      Family Psychiatric History: Moth  Family History:  Family History  Problem Relation Age of Onset  . Heart disease Father   . Heart attack Father 50  . Hyperlipidemia Mother   . Hypertension Mother     Social History:   Social History   Social History  . Marital status: Single    Spouse name: N/A  . Number of  children: N/A  . Years of education: N/A   Social History Main Topics  . Smoking status: Current Every Day Smoker    Packs/day: 2.00    Years: 30.00  . Smokeless tobacco: Never Used  . Alcohol use Yes     Comment: seldom  . Drug use: No  . Sexual activity: Yes    Partners: Female    Birth control/ protection: None   Other Topics Concern  . None   Social History Narrative  . None    Additional Social History:   Allergies:  No Known Allergies  Metabolic Disorder Labs: Lab Results  Component Value Date   HGBA1C 5.6 07/12/2016   No results found for: PROLACTIN Lab Results  Component Value Date   CHOL 209 (H) 06/29/2016   TRIG 194 (H) 06/29/2016   HDL 39 (L) 06/29/2016   CHOLHDL 5.4 (H) 06/29/2016   VLDL 39 (H) 06/29/2016   LDLCALC 131 (H) 06/29/2016     Current Medications: Current Outpatient Prescriptions  Medication Sig Dispense Refill  . esomeprazole (NEXIUM) 20 MG packet Take 20 mg by mouth daily before breakfast.    . sertraline (ZOLOFT) 50 MG tablet Take 1.5 tablets (75 mg total) by mouth daily. 45 tablet 5   No current facility-administered medications for this visit.     Neurologic: Headache: No Seizure: No Paresthesias:No  Musculoskeletal: Strength & Muscle Tone: within normal limits Gait &  Station: normal Patient leans: N/A  Psychiatric Specialty Exam: ROS  Blood pressure 135/78, pulse 69, temperature 97.6 F (36.4 C), temperature source Oral, weight 227 lb (103 kg).Body mass index is 32.57 kg/m.  General Appearance: Casual  Eye Contact:  Fair  Speech:  Clear and Coherent  Volume:  Normal  Mood:  Improved   Affect: Pleasant   Thought Process:  Coherent  Orientation:  Full (Time, Place, and Person)  Thought Content:  WDL  Suicidal Thoughts:  Yes.  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 We discussed that patient will need to obtain any necessary documentation for his work from his primary care physician since he has been managing patient's depression. Patient had also been started on Remeron by his PCP which was discontinued since patient was feeling sedated. Zoloft was also increased by his primary care physician. Since his treatment for depression has been under the care of his primary care physician for the past 4 months patient will need to obtain any necessary documentation for these medications from his primary care practice  Patient encouraged to continue therapy.  He will follow-up with his primary care physician for medication management.   Elvin So, MD 3/14/201811:48 AM

## 2017-02-02 ENCOUNTER — Encounter: Payer: Self-pay | Admitting: Family Medicine

## 2017-02-08 ENCOUNTER — Encounter: Payer: Self-pay | Admitting: Family Medicine

## 2017-02-09 NOTE — Telephone Encounter (Addendum)
Responded to patient.

## 2017-07-24 ENCOUNTER — Emergency Department
Admission: EM | Admit: 2017-07-24 | Discharge: 2017-07-24 | Disposition: A | Discharge status: Home / Self Care | Payer: Worker's Compensation | Attending: Emergency Medicine | Admitting: Emergency Medicine

## 2017-07-24 ENCOUNTER — Emergency Department: Payer: Worker's Compensation

## 2017-07-24 ENCOUNTER — Encounter: Payer: Self-pay | Admitting: Emergency Medicine

## 2017-07-24 DIAGNOSIS — Y990 Civilian activity done for income or pay: Secondary | ICD-10-CM | POA: Diagnosis not present

## 2017-07-24 DIAGNOSIS — F17210 Nicotine dependence, cigarettes, uncomplicated: Secondary | ICD-10-CM | POA: Diagnosis not present

## 2017-07-24 DIAGNOSIS — S2242XA Multiple fractures of ribs, left side, initial encounter for closed fracture: Secondary | ICD-10-CM | POA: Diagnosis not present

## 2017-07-24 DIAGNOSIS — W01198A Fall on same level from slipping, tripping and stumbling with subsequent striking against other object, initial encounter: Secondary | ICD-10-CM | POA: Diagnosis not present

## 2017-07-24 DIAGNOSIS — Z79899 Other long term (current) drug therapy: Secondary | ICD-10-CM | POA: Insufficient documentation

## 2017-07-24 DIAGNOSIS — Y9301 Activity, walking, marching and hiking: Secondary | ICD-10-CM | POA: Insufficient documentation

## 2017-07-24 DIAGNOSIS — S299XXA Unspecified injury of thorax, initial encounter: Secondary | ICD-10-CM | POA: Diagnosis present

## 2017-07-24 DIAGNOSIS — Y929 Unspecified place or not applicable: Secondary | ICD-10-CM | POA: Diagnosis not present

## 2017-07-24 MED ORDER — OXYCODONE-ACETAMINOPHEN 5-325 MG PO TABS: 1.0000 | ORAL_TABLET | Freq: Four times a day (QID) | ORAL | 0 refills | Status: DC | PRN

## 2017-07-24 MED ORDER — ONDANSETRON 4 MG PO TBDP
4.0000 mg | ORAL_TABLET | Freq: Once | ORAL | Status: AC
  Administered 2017-07-24: 4 mg via ORAL
  Filled 2017-07-24: qty 1

## 2017-07-24 MED ORDER — IBUPROFEN 600 MG PO TABS: 600.0000 mg | ORAL_TABLET | Freq: Three times a day (TID) | ORAL | 0 refills | Status: DC | PRN

## 2017-07-24 MED ORDER — OXYCODONE-ACETAMINOPHEN 5-325 MG PO TABS
1.0000 | ORAL_TABLET | Freq: Once | ORAL | Status: AC
  Administered 2017-07-24: 1 via ORAL
  Filled 2017-07-24: qty 1

## 2017-07-24 NOTE — ED Notes (Signed)
See triage note  States he tripped over a curb in the dark  And hit another curb  Having increased pain to left anterior rib area

## 2017-07-24 NOTE — ED Notes (Signed)
WC urine screen performed and taken to lab

## 2017-07-24 NOTE — ED Notes (Signed)
Pt alert and oriented X4, active, cooperative, pt in NAD. RR even and unlabored, color WNL.  Pt informed to return if any life threatening symptoms occur.   

## 2017-07-24 NOTE — Discharge Instructions (Addendum)
Follow-up with your companies workman's comp medical clinic. Rib fractures take approximately 4-5 weeks to heal. Take pain medication as directed 1 every 6 hours as needed for pain. Ibuprofen 1 every 8 hours with food as needed for inflammation and mild pain. Do not drive or operate machinery while taking the Percocet. Give work limitations to Optician, dispensing.

## 2017-07-24 NOTE — ED Provider Notes (Signed)
[facility] Emergency Department Provider Note  ____________________________________________   First MD Initiated Contact with Patient 07/24/17 [phone]     (approximate)  I have reviewed the triage vital signs and the nursing notes.   HISTORY  Chief Complaint Fall   HPI Phillip Finley is a 46 y.o. male is here complaining of left rib pain. Patient states he was at work this morning and fell tripping over a curb and landing on another. He denies any head injury or loss of consciousness. Pain is increased with deep inspiration or movement. Patient is not taking any over-the-counter medication for pain prior to arrival.    Past Medical History:  Diagnosis Date  . Anxiety   . Depression   . GERD (gastroesophageal reflux disease)     Patient Active Problem List   Diagnosis Date Noted  . Severe recurrent major depression ([place]) 10/06/2016  . 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 Anxiety disorder 11/02/2015  . Barrett's esophagus 11/02/2015  . Vitamin D deficiency 11/02/2015  . Hyperlipidemia 11/02/2015  . Smoker 11/02/2015  . Family history of heart disease in male family member before age 26 11/02/2015    Past Surgical History:  Procedure Laterality Date  . NO PAST SURGERIES      Prior to Admission medications   Medication Sig Start Date End Date Taking? Authorizing Provider  esomeprazole (NEXIUM) 20 MG packet Take 20 mg by mouth daily before breakfast.    [provider]  ibuprofen (ADVIL,MOTRIN) 600 MG tablet Take 1 tablet (600 mg total) by mouth every 8 (eight) hours as needed for mild pain. 07/24/17   Johnn Hai, PA-C  oxyCODONE-acetaminophen (PERCOCET) 5-325 MG tablet Take 1 tablet by mouth every 6 (six) hours as needed for moderate pain. 07/24/17   Johnn Hai, PA-C  sertraline (ZOLOFT) 50 MG tablet Take 1.5 tablets (75 mg total) by mouth daily. 12/30/16 12/30/17  Olin Hauser, DO    Allergies Patient has  no known allergies.  Family History  Problem Relation Age of Onset  . Heart disease Father   . Heart attack Father 22  . Hyperlipidemia Mother   . Hypertension Mother     Social History Social History  Substance Use Topics  . Smoking status: Current Every Day Smoker    Packs/day: 2.00    Years: 30.00  . Smokeless tobacco: Never Used  . Alcohol use Yes     Comment: seldom    Review of Systems Constitutional: No fever/chills Eyes: No visual changes. ENT: No trauma Cardiovascular: Denies chest pain. Respiratory: Shortness of breath only due to pain and left ribs on inspiration. Gastrointestinal:  No nausea, no vomiting. Musculoskeletal: Positive for left rib pain. Skin: No abrasions or ecchymosis. Neurological: Negative for headaches, focal weakness or numbness. ___________________________________________   PHYSICAL EXAM:  VITAL SIGNS: ED Triage Vitals  Enc Vitals Group     BP 07/24/17 0845 134/76     Pulse Rate 07/24/17 0845 77     Resp 07/24/17 0845 18     Temp 07/24/17 0845 98 F (36.7 C)     Temp Source 07/24/17 0845 Oral     SpO2 07/24/17 0845 97 %     Weight 07/24/17 0846 215 lb (97.5 kg)     Height 07/24/17 0846 5\' 10"  (1.778 m)     Head Circumference --      Peak Flow --      Pain Score 07/24/17 0845 8     Pain  Loc --      Pain Edu? --      Excl. in [place]? --    Constitutional: Alert and oriented. Well appearing and in no acute distress. Eyes: Conjunctivae are normal.  Head: Atraumatic. Nose: No trauma Neck: No stridor.   Cardiovascular: Normal rate, regular rhythm. Grossly normal heart sounds.  Good peripheral circulation. Respiratory: On examination of the chest there is no gross deformity. There is marked tenderness on palpation of the left lateral ribs without evidence of deformity. No ecchymosis or abrasions seen. Lungs are clear bilaterally with poor inspiration secondary to pain. Musculoskeletal: Moves upper and lower extremities without any  difficulty. Normal gait was noted. Neurologic:  Normal speech and language. No gross focal neurologic deficits are appreciated.  Skin:  Skin is warm, dry and intact.  Psychiatric: Mood and affect are normal. Speech and behavior are normal.  ____________________________________________   LABS (all labs ordered are listed, but only abnormal results are displayed)  Labs Reviewed - No data to display  RADIOLOGY  Dg Ribs Unilateral W/chest Left  Result Date: 07/24/2017 CLINICAL DATA:  Fall, injury, chest pain EXAM: LEFT RIBS AND CHEST - 3+ VIEW COMPARISON:  10/06/2015 FINDINGS: Lungs remain clear. No focal pneumonia, collapse or consolidation. Negative for effusion or pneumothorax. Trachea is midline. Normal heart size and vascularity. Very minimal osseous irregularity of the left fifth and sixth ribs anteriorly suspicious for nondisplaced rib fractures. No significant displaced fracture. No chest wall soft tissue asymmetry or subcutaneous emphysema. IMPRESSION: Findings suspicious for subtle nondisplaced left fifth and sixth rib anterolateral fractures. No other complicating feature. Electronically Signed   By: Jerilynn Mages.  Shick M.D.   On: 07/24/2017 09:53    ____________________________________________   PROCEDURES  Procedure(s) performed: None  Procedures  Critical Care performed: No  ____________________________________________   INITIAL IMPRESSION / ASSESSMENT AND PLAN / ED COURSE  Pertinent labs & imaging results that were available during my care of the patient were reviewed by me and considered in my medical decision making (see chart for details).  Patient was made aware that he has 2 rib fractures. 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 He will also find that there is a medical clinic that his company uses for their workman's comp injuries. Patient was given Percocet while in the emergency department and got moderate relief. Patient was  discharged with a prescription for Percocet 1 every 6 hours as needed for moderate pain. Ibuprofen 600 mg one with food every 8 hours for pain and inflammation. He will return to work to give his papers to his supervisor for them to make a determination based on his restrictions.   ___________________________________________   FINAL CLINICAL IMPRESSION(S) / ED DIAGNOSES  Final diagnoses:  Fracture of two ribs, left, closed, initial encounter      NEW MEDICATIONS STARTED DURING THIS VISIT:  Discharge Medication List as of 07/24/2017 10:52 AM    START taking these medications   Details  ibuprofen (ADVIL,MOTRIN) 600 MG tablet Take 1 tablet (600 mg total) by mouth every 8 (eight) hours as needed for mild pain., Starting Mon 07/24/2017, Print    oxyCODONE-acetaminophen (PERCOCET) 5-325 MG tablet Take 1 tablet by mouth every 6 (six) hours as needed for moderate pain., Starting Mon 07/24/2017, Print         Note:  This document was prepared using Dragon voice recognition software and may include unintentional dictation errors.    Johnn Hai, PA-C 07/24/17  [place] Rebecca, MD 07/24/17 1209

## 2017-07-24 NOTE — ED Triage Notes (Signed)
Pt to ed with c/o left side rib pain and soreness. Pt states he was at work this am and fell tripping over a curb and landing on another curb.  Pt states worse pain with deep breath and movement.

## 2017-08-07 ENCOUNTER — Ambulatory Visit
Admission: EM | Admit: 2017-08-07 | Discharge: 2017-08-07 | Discharge status: Absent without leave | Payer: Worker's Compensation

## 2017-08-08 ENCOUNTER — Other Ambulatory Visit: Payer: Self-pay | Admitting: Family

## 2017-08-08 ENCOUNTER — Ambulatory Visit
Admission: RE | Admit: 2017-08-08 | Discharge: 2017-08-08 | Disposition: A | Discharge status: Home / Self Care | Payer: Worker's Compensation | Source: Ambulatory Visit | Attending: Family | Admitting: Family

## 2017-08-08 ENCOUNTER — Ambulatory Visit
Admission: AD | Admit: 2017-08-08 | Discharge: 2017-08-08 | Disposition: A | Discharge status: Home / Self Care | Payer: Worker's Compensation | Source: Ambulatory Visit | Attending: Family | Admitting: Family

## 2017-08-08 DIAGNOSIS — M25512 Pain in left shoulder: Secondary | ICD-10-CM | POA: Insufficient documentation

## 2017-08-08 DIAGNOSIS — W19XXXA Unspecified fall, initial encounter: Secondary | ICD-10-CM | POA: Diagnosis not present

## 2017-08-08 DIAGNOSIS — R52 Pain, unspecified: Secondary | ICD-10-CM

## 2017-10-07 IMAGING — CR DG CHEST 2V
2 series · 2 of 2 positions shown · non-contrast
Comparison: None.

CLINICAL DATA: Chest pain

EXAM:
CHEST  2 VIEW

[chest pa]
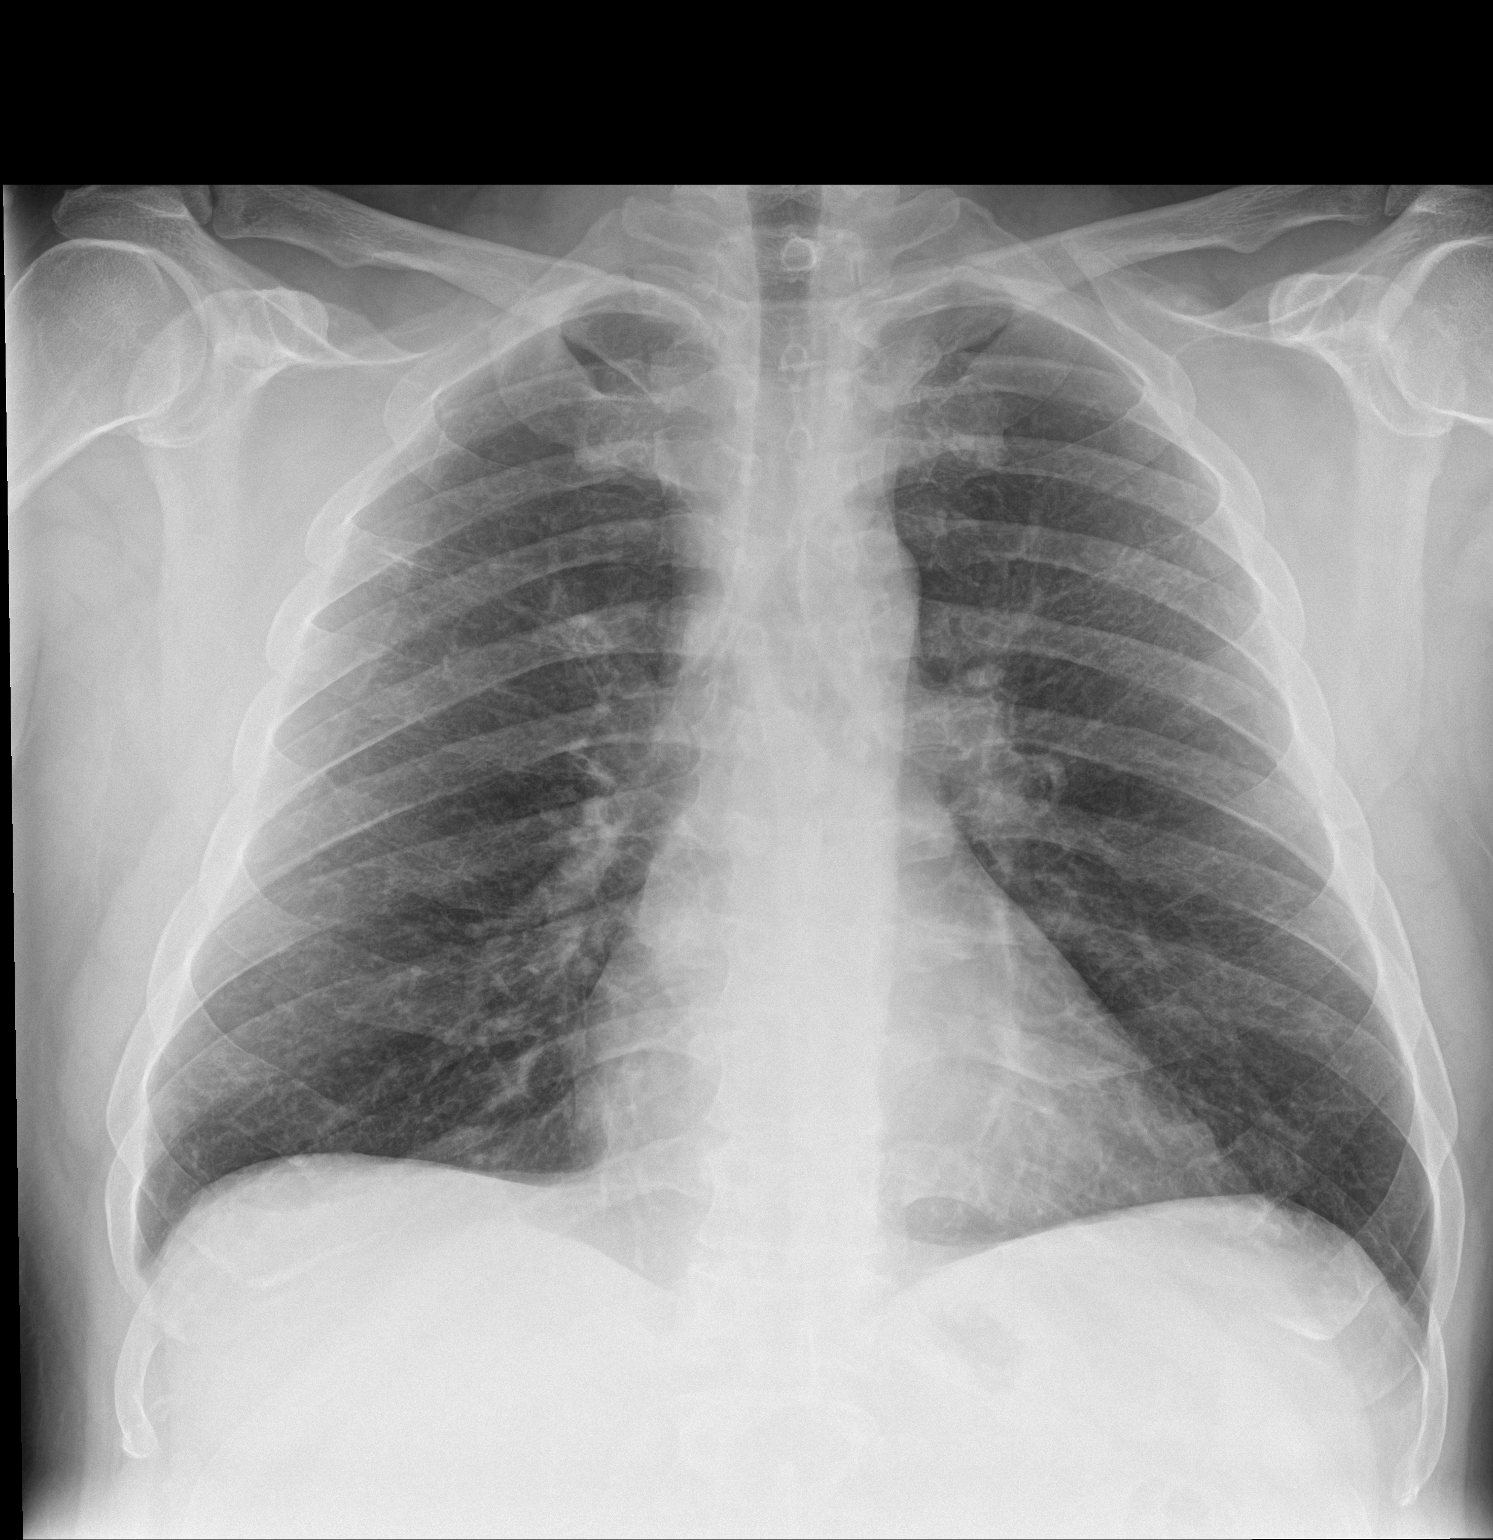

[chest lat]
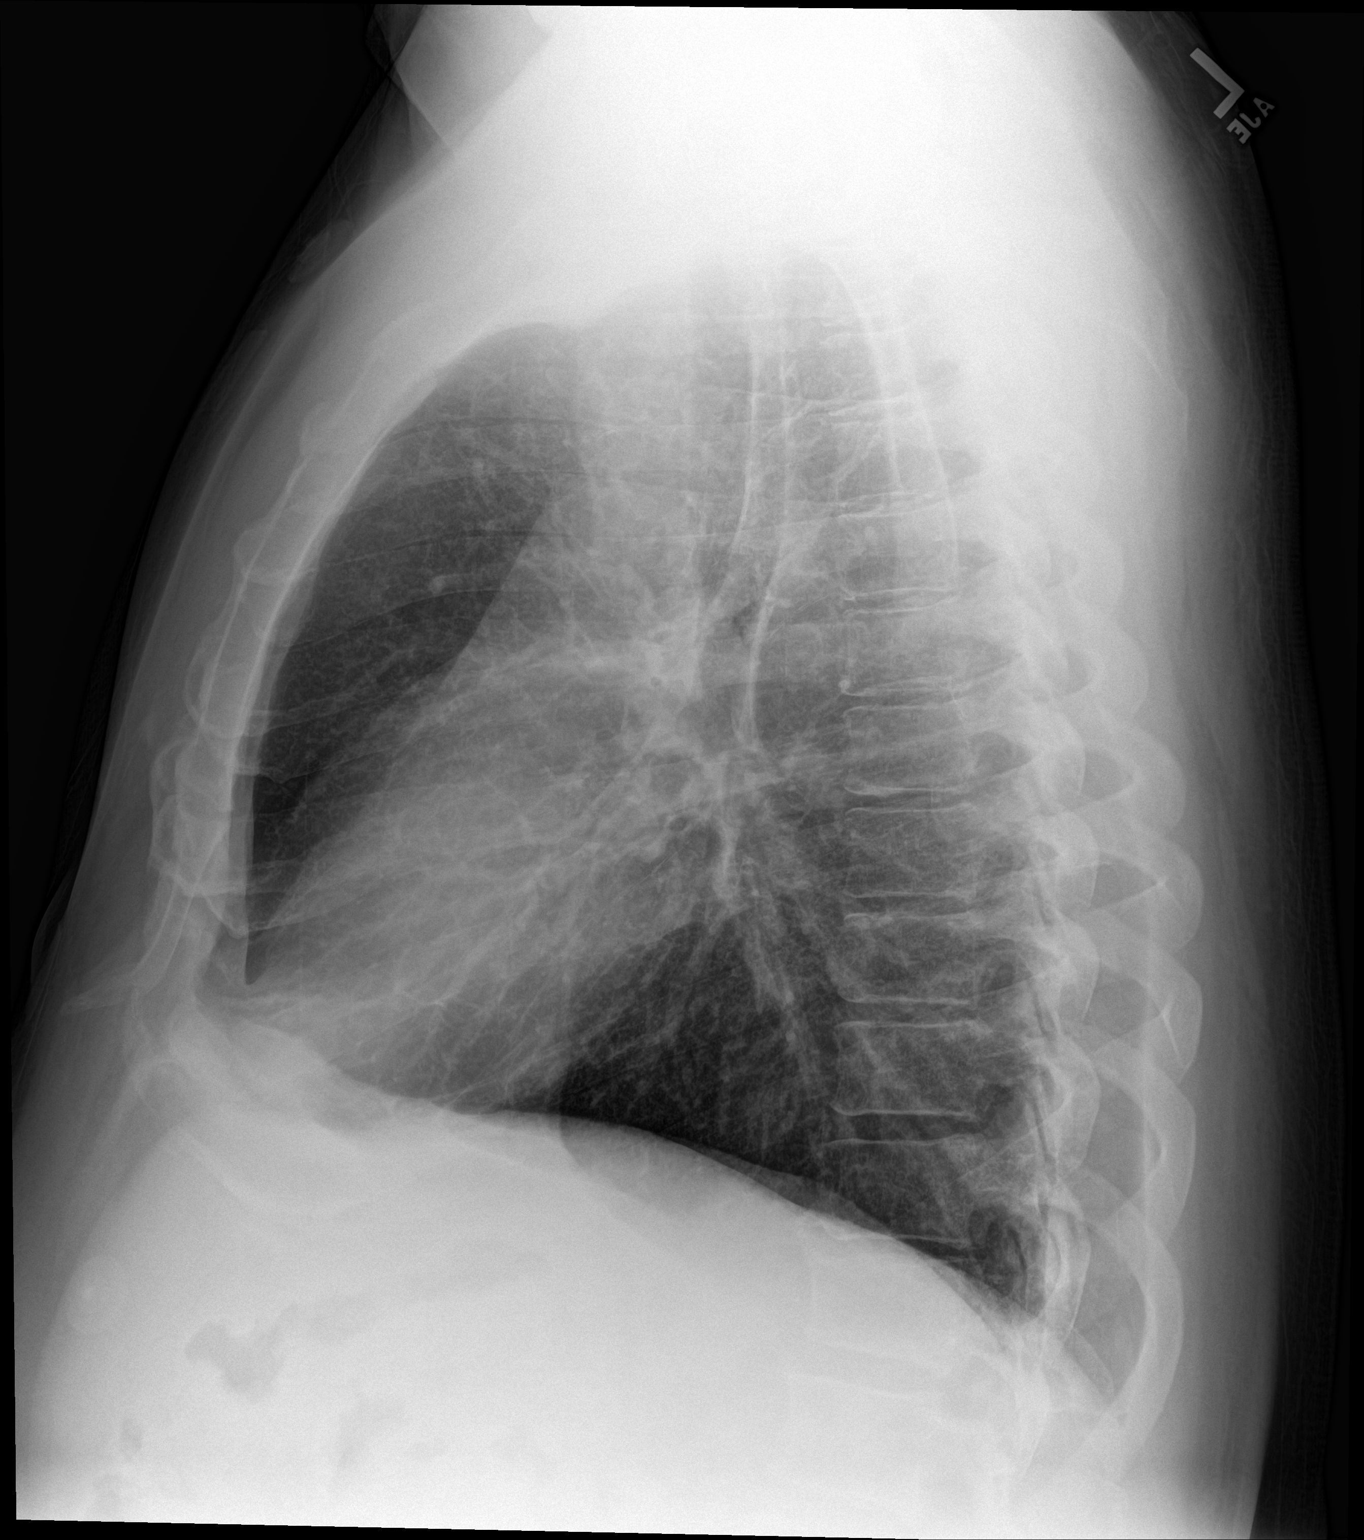

[2 of 2 positions shown; findings below may reference images not displayed]

FINDINGS: The heart size and mediastinal contours are within normal limits.
Both lungs are clear. The visualized skeletal structures are
unremarkable.
IMPRESSION: No active cardiopulmonary disease.

## 2018-02-14 ENCOUNTER — Other Ambulatory Visit: Payer: Self-pay | Admitting: Family Medicine

## 2018-02-14 DIAGNOSIS — F411 Generalized anxiety disorder: Secondary | ICD-10-CM

## 2018-02-14 DIAGNOSIS — F332 Major depressive disorder, recurrent severe without psychotic features: Secondary | ICD-10-CM

## 2018-02-24 ENCOUNTER — Emergency Department
Admission: EM | Admit: 2018-02-24 | Discharge: 2018-02-24 | Disposition: A | Discharge status: Home / Self Care | Payer: PRIVATE HEALTH INSURANCE | Attending: Emergency Medicine | Admitting: Emergency Medicine

## 2018-02-24 ENCOUNTER — Other Ambulatory Visit: Payer: Self-pay

## 2018-02-24 DIAGNOSIS — M549 Dorsalgia, unspecified: Secondary | ICD-10-CM | POA: Diagnosis present

## 2018-02-24 DIAGNOSIS — F17200 Nicotine dependence, unspecified, uncomplicated: Secondary | ICD-10-CM | POA: Insufficient documentation

## 2018-02-24 DIAGNOSIS — Z79899 Other long term (current) drug therapy: Secondary | ICD-10-CM | POA: Insufficient documentation

## 2018-02-24 DIAGNOSIS — M62838 Other muscle spasm: Secondary | ICD-10-CM | POA: Insufficient documentation

## 2018-02-24 MED ORDER — PREDNISONE 50 MG PO TABS: ORAL_TABLET | ORAL | 0 refills | Status: DC

## 2018-02-24 MED ORDER — METHOCARBAMOL 500 MG PO TABS: 500.0000 mg | ORAL_TABLET | Freq: Three times a day (TID) | ORAL | 0 refills | Status: AC | PRN

## 2018-02-24 MED ORDER — KETOROLAC TROMETHAMINE 30 MG/ML IJ SOLN
30.0000 mg | Freq: Once | INTRAMUSCULAR | Status: AC
  Administered 2018-02-24: 30 mg via INTRAMUSCULAR
  Filled 2018-02-24: qty 1

## 2018-02-24 MED ORDER — ORPHENADRINE CITRATE 30 MG/ML IJ SOLN
60.0000 mg | Freq: Two times a day (BID) | INTRAMUSCULAR | Status: DC
  Administered 2018-02-24: 60 mg via INTRAMUSCULAR
  Filled 2018-02-24 (×2): qty 2

## 2018-02-24 NOTE — ED Triage Notes (Signed)
Pt states x few days he has been having L upper back, shoulder, neck pain. States thought it was pulled muscle d/t heavy lifting at work. States not getting better. Alert, oriented, ambulatory. No distress noted.

## 2018-02-24 NOTE — ED Provider Notes (Signed)
[facility] Emergency Department Provider Note  ____________________________________________  Time seen: Approximately 11:17 PM  I have reviewed the triage vital signs and the nursing notes.   HISTORY  Chief Complaint Back Pain and Neck Pain    HPI Phillip Finley is a 47 y.o. 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  Patient denies falls or mechanisms of trauma.  No prior chronic neck or upper back pain.  Patient denies radiculopathy of the upper extremity.  No weakness or changes in sensation of the upper extremities.  No alleviating measures of been attempted.  Patient currently rates his pain at 8 out of 10 in intensity and describes pain as aching.   Past Medical History:  Diagnosis Date  . Anxiety   . Depression   . GERD (gastroesophageal reflux disease)     Patient Active Problem List   Diagnosis Date Noted  . Severe recurrent major depression ([place]) 10/06/2016  . 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 Anxiety disorder 11/02/2015  . Barrett's esophagus 11/02/2015  . Vitamin D deficiency 11/02/2015  . Hyperlipidemia 11/02/2015  . Smoker 11/02/2015  . Family history of heart disease in male family member before age 42 11/02/2015    Past Surgical History:  Procedure Laterality Date  . NO PAST SURGERIES      Prior to Admission medications   Medication Sig Start Date End Date Taking? Authorizing Provider  esomeprazole (NEXIUM) 20 MG packet Take 20 mg by mouth daily before breakfast.    [provider]  ibuprofen (ADVIL,MOTRIN) 600 MG tablet Take 1 tablet (600 mg total) by mouth every 8 (eight) hours as needed for mild pain. 07/24/17   Johnn Hai, PA-C  methocarbamol (ROBAXIN) 500 MG tablet Take 1 tablet (500 mg total) by mouth 3 (three) times daily as needed for up to 5 days for muscle spasms. 02/24/18 03/01/18  Lannie Fields, PA-C   oxyCODONE-acetaminophen (PERCOCET) 5-325 MG tablet Take 1 tablet by mouth every 6 (six) hours as needed for moderate pain. 07/24/17   Johnn Hai, PA-C  predniSONE (DELTASONE) 50 MG tablet Take one 50 mg tablet one daily for the next five days. 02/24/18   Lannie Fields, PA-C  sertraline (ZOLOFT) 50 MG tablet Take 1.5 tablets (75 mg total) by mouth daily. 12/30/16 12/30/17  Olin Hauser, DO  sertraline (ZOLOFT) 50 MG tablet TAKE ONE & ONE-HALF TABLETS BY MOUTH ONCE DAILY 02/14/18   Olin Hauser, DO    Allergies Patient has no known allergies.  Family History  Problem Relation Age of Onset  . Heart disease Father   . Heart attack Father 77  . Hyperlipidemia Mother   . Hypertension Mother     Social History Social History   Tobacco Use  . Smoking status: Current Every Day Smoker    Packs/day: 2.00    Years: 30.00    Pack years: 60.00  . Smokeless tobacco: Never Used  Substance Use Topics  . Alcohol use: Yes    Comment: seldom  . Drug use: No     Review of Systems  Constitutional: No fever/chills Eyes: No visual changes. No discharge ENT: No upper respiratory complaints. Cardiovascular: no chest pain. Respiratory: no cough. No SOB. Gastrointestinal: No abdominal pain.  No nausea, no vomiting.  No diarrhea.  No constipation. Musculoskeletal: Patient has upper trapezius pain. Skin: Negative for rash, abrasions, lacerations, ecchymosis. Neurological: Negative  for headaches, focal weakness or numbness.   ____________________________________________   PHYSICAL EXAM:  VITAL SIGNS: ED Triage Vitals  Enc Vitals Group     BP 02/24/18 1706 122/73     Pulse Rate 02/24/18 1706 74     Resp 02/24/18 1706 16     Temp 02/24/18 1706 98.3 F (36.8 C)     Temp Source 02/24/18 1706 Oral     SpO2 02/24/18 1706 97 %     Weight 02/24/18 1707 220 lb (99.8 kg)     Height 02/24/18 1707 5\' 10"  (1.778 m)     Head Circumference --      Peak Flow --      Pain  Score 02/24/18 1706 6     Pain Loc --      Pain Edu? --      Excl. in [place]? --      Constitutional: Alert and oriented. Well appearing and in no acute distress. Eyes: Conjunctivae are normal. PERRL. EOMI. Head: Atraumatic. Cardiovascular: Normal rate, regular rhythm. Normal S1 and S2.  Good peripheral circulation. Respiratory: Normal respiratory effort without tachypnea or retractions. Lungs CTAB. Good air entry to the bases with no decreased or absent breath sounds. Musculoskeletal: Full range of motion to all extremities. No gross deformities appreciated.  Patient has tenderness elicited with palpation of the upper trapezius. Neurologic:  Normal speech and language. No gross focal neurologic deficits are appreciated.  Skin:  Skin is warm, dry and intact. No rash noted. Psychiatric: Mood and affect are normal. Speech and behavior are normal. Patient exhibits appropriate insight and judgement.   ____________________________________________   LABS (all labs ordered are listed, but only abnormal results are displayed)  Labs Reviewed - No data to display ____________________________________________  EKG   ____________________________________________  RADIOLOGY   No results found.  ____________________________________________    PROCEDURES  Procedure(s) performed:    Procedures    Medications  orphenadrine (NORFLEX) injection 60 mg (60 mg Intramuscular Given 02/24/18 1958)  ketorolac (TORADOL) 30 MG/ML injection 30 mg (30 mg Intramuscular Given 02/24/18 1958)     ____________________________________________   INITIAL IMPRESSION / ASSESSMENT AND PLAN / ED COURSE  Pertinent labs & imaging results that were available during my care of the patient were reviewed by me and considered in my medical decision making (see chart for details).  Review of the [place] CSRS was performed in accordance of the [place] prior to dispensing any controlled drugs.     Assessment and  plan Muscle spasm Patient presents to the emergency department with spasms of the upper trapezius.  Patient reports that his pain improved significantly with Toradol and Norflex that was administered in the emergency department.  Patient was discharged with prednisone and Robaxin.  Vital signs are reassuring prior to discharge.  All patient questions were answered.     ____________________________________________  FINAL CLINICAL IMPRESSION(S) / ED DIAGNOSES  Final diagnoses:  Muscle spasm      NEW MEDICATIONS STARTED DURING THIS VISIT:  ED Discharge Orders        Ordered    predniSONE (DELTASONE) 50 MG tablet     02/24/18 2034    methocarbamol (ROBAXIN) 500 MG tablet  3 times daily PRN     02/24/18 2034          This chart was dictated using voice recognition software/Dragon. Despite best efforts to proofread, errors can occur which can change the meaning. Any change was purely unintentional.    Lannie Fields, PA-C 02/24/18  1504    Carrie Mew, MD 02/24/18 [phone]

## 2018-04-20 ENCOUNTER — Other Ambulatory Visit: Payer: Self-pay | Admitting: Family Medicine

## 2018-04-20 DIAGNOSIS — F332 Major depressive disorder, recurrent severe without psychotic features: Secondary | ICD-10-CM

## 2018-04-20 DIAGNOSIS — F411 Generalized anxiety disorder: Secondary | ICD-10-CM

## 2018-07-04 DIAGNOSIS — M542 Cervicalgia: Secondary | ICD-10-CM | POA: Insufficient documentation

## 2018-09-19 ENCOUNTER — Other Ambulatory Visit: Payer: Self-pay | Admitting: Family Medicine

## 2018-09-19 ENCOUNTER — Telehealth: Payer: Self-pay | Admitting: Family Medicine

## 2018-09-19 DIAGNOSIS — F332 Major depressive disorder, recurrent severe without psychotic features: Secondary | ICD-10-CM

## 2018-09-19 DIAGNOSIS — F411 Generalized anxiety disorder: Secondary | ICD-10-CM

## 2018-09-19 MED ORDER — SERTRALINE HCL 50 MG PO TABS: ORAL_TABLET | ORAL | 0 refills | Status: DC

## 2018-09-19 NOTE — Telephone Encounter (Signed)
Patient advised as per Dr K. 

## 2018-09-19 NOTE — Telephone Encounter (Signed)
Refill once more then he needs appointment as soon as he is able.  Nobie Putnam, DO [facility] 09/19/2018, 1:30 PM

## 2018-09-19 NOTE — Telephone Encounter (Signed)
Pt needs a refill on sertraline sent to [facility].  His call back [phone]

## 2018-12-18 ENCOUNTER — Ambulatory Visit (INDEPENDENT_AMBULATORY_CARE_PROVIDER_SITE_OTHER): Payer: Self-pay | Admitting: Family Medicine

## 2018-12-18 ENCOUNTER — Other Ambulatory Visit: Payer: Self-pay

## 2018-12-18 VITALS — BP 128/80 | HR 80 | Ht 70.0 in | Wt 240.6 lb

## 2018-12-18 DIAGNOSIS — F5104 Psychophysiologic insomnia: Secondary | ICD-10-CM | POA: Insufficient documentation

## 2018-12-18 DIAGNOSIS — F411 Generalized anxiety disorder: Secondary | ICD-10-CM

## 2018-12-18 DIAGNOSIS — F3341 Major depressive disorder, recurrent, in partial remission: Secondary | ICD-10-CM | POA: Insufficient documentation

## 2018-12-18 DIAGNOSIS — F331 Major depressive disorder, recurrent, moderate: Secondary | ICD-10-CM

## 2018-12-18 MED ORDER — SERTRALINE HCL 100 MG PO TABS: 100.0000 mg | ORAL_TABLET | Freq: Every day | ORAL | 1 refills | Status: DC

## 2018-12-18 MED ORDER — ZOLPIDEM TARTRATE 5 MG PO TABS: 5.0000 mg | ORAL_TABLET | Freq: Every evening | ORAL | 2 refills | Status: DC | PRN

## 2018-12-18 NOTE — Progress Notes (Signed)
Subjective:    Patient ID: Phillip Finley, male    DOB: 05-26-1971, 48 y.o.   MRN: [id number]  Phillip Finley is a 48 y.o. male presenting on 12/18/2018 for Anxiety and Depression  Note patient was lost to follow-up since 12/2016 (last visit with me) and has not followed up with Psychiatry [facility] either.  HPI   Chronic Depression recurrent - Active / Anxiety / Insomnia: Last visit 12/2016, he was adjusted on Zoloft, and added mirtazapine in past for sleep insomnia, has tried trazodone, followed by [facility], overall had difficulty with work being out due to depression. - Ultimately in interval 2 years he has done well. Mood has improved, he has continued medication on Zoloft 75mg  daily (1.5 pills), has remained off mirtazapine and trazodone, sleep has improved but still not perfect he admits some difficulty falling back asleep often, even if can sleep initially - He still believes that mentally he is "so much better" - he would like to continue Zoloft and asking about sleep med. - Admits recent mood has been "dragged down" by his neck problem with OA/DJD pain - Admits at times feels a rush of wave feeling throughout body - difficult to describe - Denies any active panic attacks, manic symptoms, racing thoughts  Cervical OA/DJD / Neck Pain Followed already by [facility], [facility] Dr Angola has had injection therapy in past, limited relief, also chiropractor, now proceed with RFA neck - he will take Diazepm before that procedure   Depression screen [facility] 2/9 12/18/2018 12/30/2016 11/09/2016  Decreased Interest 2 1 3   Down, Depressed, Hopeless 1 2 3   PHQ - 2 Score 3 3 6   Altered sleeping 3 3 3   Tired, decreased energy 3 1 3   Change in appetite 0 1 0  Feeling bad or failure about yourself  1 1 1   Trouble concentrating 1 0 1  Moving slowly or fidgety/restless 1 2 1   Suicidal thoughts 1 0 0  PHQ-9 Score 13 11 15   Difficult doing work/chores Not difficult at all Somewhat difficult Somewhat  difficult   GAD 7 : Generalized Anxiety Score 12/18/2018 12/30/2016 11/09/2016 08/29/2016  Nervous, Anxious, on Edge 2 2 3 3   Control/stop worrying 2 1 1 2   Worry too much - different things 3 2 1 2   Trouble relaxing 1 0 1 2  Restless 2 1 3 2   Easily annoyed or irritable 2 0 0 1  Afraid - awful might happen 2 1 1  0  Total GAD 7 Score 14 7 10 12   Anxiety Difficulty Not difficult at all Not difficult at all Not difficult at all Not difficult at all    Social History   Tobacco Use  . Smoking status: Current Every Day Smoker    Packs/day: 2.00    Years: 30.00    Pack years: 60.00  . Smokeless tobacco: Never Used  Substance Use Topics  . Alcohol use: Yes    Comment: seldom  . Drug use: No    Review of Systems Per HPI unless specifically indicated above     Objective:    BP 128/80 (BP Location: Left Arm, Patient Position: Sitting, Cuff Size: Normal)   Pulse 80   Ht 5\' 10"  (1.778 m)   Wt 240 lb 9.8 oz (109.1 kg)   BMI 34.52 kg/m   Wt Readings from Last 3 Encounters:  12/19/18 240 lb 9.8 oz (109.1 kg)  02/24/18 220 lb (99.8 kg)  07/24/17 215 lb (97.5 kg)  Physical Exam Vitals signs and nursing note reviewed.  Constitutional:      General: He is not in acute distress.    Appearance: He is well-developed. He is not diaphoretic.     Comments: Well-appearing, comfortable, cooperative  HENT:     Head: Normocephalic and atraumatic.  Eyes:     General:        Right eye: No discharge.        Left eye: No discharge.     Conjunctiva/sclera: Conjunctivae normal.  Cardiovascular:     Rate and Rhythm: Normal rate.  Pulmonary:     Effort: Pulmonary effort is normal.  Skin:    General: Skin is warm and dry.     Findings: No erythema or rash.  Neurological:     Mental Status: He is alert and oriented to person, place, and time.  Psychiatric:        Behavior: Behavior normal.     Comments: Well groomed, good eye contact, normal speech and thoughts. Good mood currently, more  positive.        Assessment & Plan:   Problem List Items Addressed This Visit    Generalized anxiety disorder   Relevant Medications   diazepam (VALIUM) 5 MG tablet   sertraline (ZOLOFT) 100 MG tablet   Moderate episode of recurrent major depressive disorder ([place]) - Primary    Clinically improved major depression, see PHQ, has remained on SSRI - Still admits mild passive suicidal ideation but no active plan or other concerns - No longer w/ [facility] - Off Mirtazapine, Trazodone  Plan INCREASE rx Zoloft from 75mg  up to 100mg  - dose adjust as discussed New start Ambien 5mg  nightly PRN #15 with refill, may use occasional to help improve sleep profile, may consider CR 6.25 or other dose if indicated in future if still waking up. May reconsider trazodone in future if needed. - sleep hygiene given handout Follow-up in future if need return to Psych or other counseling/therapy      Relevant Medications   diazepam (VALIUM) 5 MG tablet   sertraline (ZOLOFT) 100 MG tablet   Psychophysiological insomnia    See A&P Depression Likely underlying secondary issue  Trial Ambien 5mg  QHS PRN Sleep Hygiene Increase Zoloft to 100mg       Relevant Medications   zolpidem (AMBIEN) 5 MG tablet      Meds ordered this encounter  Medications  . zolpidem (AMBIEN) 5 MG tablet    Sig: Take 1 tablet (5 mg total) by mouth at bedtime as needed for sleep.    Dispense:  15 tablet    Refill:  2  . sertraline (ZOLOFT) 100 MG tablet    Sig: Take 1 tablet (100 mg total) by mouth daily.    Dispense:  90 tablet    Refill:  1      Follow up plan: Return in about 6 months (around 06/18/2019) for 6 month follow-up Depression/mood.  Considered future labs, but given possible change of insurance, patient request to hold for now.  Nobie Putnam, [facility] 12/18/2018, 5:00 PM

## 2018-12-18 NOTE — Patient Instructions (Addendum)
Thank you for coming to the office today.  Increased Zoloft (sertraline) from 75 up to 100mg  - one whole pill daily, new rx sent  Added Ambien as needed for sleep. Not every night, try to reset sleep and use sparingly  Ask Dr Angola if need muscle relaxant again in future, can help sleep and help muscle symptoms in neck  Have them send me copy of your next visit and record.  IF YOU ARE COVERED:  DUE for FASTING BLOOD WORK (no food or drink after midnight before the lab appointment, only water or coffee without cream/sugar on the morning of)  SCHEDULE "Lab Only" visit in the morning at the clinic for lab draw in 6 MONTHS   - Make sure Lab Only appointment is at about 1 week before your next appointment, so that results will be available  For Lab Results, once available within 2-3 days of blood draw, you can can log in to MyChart online to view your results and a brief explanation. Also, we can discuss results at next follow-up visit.    Sleep Hygiene Recommendations to promote healthy sleep in all patients, especially if symptoms of insomnia are worsening. Due to the nature of sleep rhythms, if your body gets "out of rhythm", it may take some time before your sleep cycle can be "reset".  Please try to follow as many of the following tips as you can, usually there are only a few of these are the primary cause of the problem.  ?To reset your sleep rhythm, go to bed and get up at the same time every day ?Sleep only long enough to feel rested and then get out of bed ?Do not try to force yourself to sleep. If you can't sleep, get out of bed and try again later. ?Avoid naps during the day, unless excessively tired. The more sleeping during the day, then the less sleep your body needs at night.  ?Have coffee, tea, and other foods that have caffeine only in the morning ?Exercise several days a week, but not right before bed ?If you drink alcohol, prefer to have appropriate drink with one meal,  but prefer to avoid alcohol in the evening, and bedtime ?If you smoke, avoid smoking, especially in the evening  ?Avoid watching TV or looking at phones, computers, or reading devices ("e-books") that give off light at least 30 minutes before bed. This artificial light sends "awake signals" to your brain and can make it harder to fall asleep. ?Make your bedroom a comfortable place where it is easy to fall asleep: ? Put up shades or special blackout curtains to block light from outside. ? Use a white noise machine to block noise. ? Keep the temperature cool. ?Try your best to solve or at least address your problems before you go to bed ?Use relaxation techniques to manage stress. Ask your health care provider to suggest some techniques that may work well for you. These may include: ? Breathing exercises. ? Routines to release muscle tension. ? Visualizing peaceful scenes.   Please schedule a Follow-up Appointment to: Return in about 6 months (around 06/18/2019) for 6 month follow-up Depression/mood.  If you have any other questions or concerns, please feel free to call the office or send a message through [place]. You may also schedule an earlier appointment if necessary.  Additionally, you may be receiving a survey about your experience at our office within a few days to 1 week by e-mail or mail. We value your feedback.  Nobie Putnam, DO [place]

## 2018-12-19 ENCOUNTER — Encounter: Payer: Self-pay | Admitting: Family Medicine

## 2018-12-19 NOTE — Assessment & Plan Note (Signed)
See A&P Depression Likely underlying secondary issue  Trial Ambien 5mg  QHS PRN Sleep Hygiene Increase Zoloft to 100mg 

## 2018-12-19 NOTE — Assessment & Plan Note (Signed)
Clinically improved major depression, see PHQ, has remained on SSRI - Still admits mild passive suicidal ideation but no active plan or other concerns - No longer w/ [facility] - Off Mirtazapine, Trazodone  Plan INCREASE rx Zoloft from 75mg  up to 100mg  - dose adjust as discussed New start Ambien 5mg  nightly PRN #15 with refill, may use occasional to help improve sleep profile, may consider CR 6.25 or other dose if indicated in future if still waking up. May reconsider trazodone in future if needed. - sleep hygiene given handout Follow-up in future if need return to Psych or other counseling/therapy

## 2020-01-03 ENCOUNTER — Encounter: Payer: Self-pay | Admitting: Family Medicine

## 2020-01-03 ENCOUNTER — Other Ambulatory Visit: Payer: Self-pay

## 2020-01-03 ENCOUNTER — Ambulatory Visit (INDEPENDENT_AMBULATORY_CARE_PROVIDER_SITE_OTHER): Payer: Medicaid Other | Admitting: Family Medicine

## 2020-01-03 VITALS — BP 130/68 | HR 73 | Temp 97.3°F | Resp 16 | Ht 70.0 in | Wt 232.0 lb

## 2020-01-03 DIAGNOSIS — F5104 Psychophysiologic insomnia: Secondary | ICD-10-CM

## 2020-01-03 DIAGNOSIS — F331 Major depressive disorder, recurrent, moderate: Secondary | ICD-10-CM | POA: Diagnosis not present

## 2020-01-03 DIAGNOSIS — F411 Generalized anxiety disorder: Secondary | ICD-10-CM

## 2020-01-03 MED ORDER — SERTRALINE HCL 100 MG PO TABS: 100.0000 mg | ORAL_TABLET | Freq: Every day | ORAL | 3 refills | Status: DC

## 2020-01-03 NOTE — Progress Notes (Signed)
Subjective:    Patient ID: Phillip Finley, male    DOB: 09-28-1971, 49 y.o.   MRN: [id number]  Phillip Finley is a 49 y.o. male presenting on 01/03/2020 for Anxiety   HPI  Chronic Depression recurrent - Active / Anxiety / Insomnia: - Last visit with me 11/2018, for same problems mood, anxiety insomnia, treated with sertraline, ambien, see prior notes for background information. - Interval update without significant change, not worse or significantly improved - Today patient reports he is doing fairly well. Still has mood and anxiety issues that affect him. Improved on Sertraline, needs refill for 100mg  daily dose. He has tried Trazodone in past. - Insomnia - 4 hours max, he sleeps on his stomach usually, often can sleep on his side, still tired, sometimes only sleep 2 hours at a time, he does admit getting uncomfortable overnight, prior neck injury and nerve ablation, has muscle cramps. He was following [facility] Dr Cristy Folks / Dr Angola for following this issue. History of bulging cervical spine disc. Last visit in 06/2019. Left side issue. - he tried Ambien in past as well recently thinks it helps some but does not help keep him asleep usually he can fall asleep but will wake up with other issues - He takes Gabapentin that helps him sleep at times. He is interested in adjusting this one, it was rx by Ortho last time. - He denies significant snoring or sleep apnea symptoms. - Denies any active panic attacks, manic symptoms, racing thoughts  Health Maintenance: Due for Flu Shot, declines today despite counseling on benefits   Depression screen [facility] 2/9 01/03/2020 12/18/2018 12/30/2016  Decreased Interest 2 2 1   Down, Depressed, Hopeless 1 1 2   PHQ - 2 Score 3 3 3   Altered sleeping 3 3 3   Tired, decreased energy 3 3 1   Change in appetite 0 0 1  Feeling bad or failure about yourself  1 1 1   Trouble concentrating 1 1 0  Moving slowly or fidgety/restless 0 1 2  Suicidal thoughts 0 1 0    PHQ-9 Score 11 13 11   Difficult doing work/chores Not difficult at all Not difficult at all Somewhat difficult   GAD 7 : Generalized Anxiety Score 01/03/2020 12/18/2018 12/30/2016 11/09/2016  Nervous, Anxious, on Edge 3 2 2 3   Control/stop worrying 1 2 1 1   Worry too much - different things 2 3 2 1   Trouble relaxing 2 1 0 1  Restless 3 2 1 3   Easily annoyed or irritable 1 2 0 0  Afraid - awful might happen 1 2 1 1   Total GAD 7 Score 13 14 7 10   Anxiety Difficulty Not difficult at all Not difficult at all Not difficult at all Not difficult at all     Social History   Tobacco Use  . Smoking status: Current Every Day Smoker    Packs/day: 2.00    Years: 30.00    Pack years: 60.00  . Smokeless tobacco: Never Used  Substance Use Topics  . Alcohol use: Yes    Comment: seldom  . Drug use: No    Review of Systems Per HPI unless specifically indicated above     Objective:    BP 130/68   Pulse 73   Temp (!) 97.3 F (36.3 C) (Oral)   Resp 16   Ht 5\' 10"  (1.778 m)   Wt 232 lb (105.2 kg)   BMI 33.29 kg/m   Wt Readings from Last 3 Encounters:  01/03/20 232 lb (105.2 kg)  12/19/18 240 lb 9.8 oz (109.1 kg)  02/24/18 220 lb (99.8 kg)    Physical Exam Vitals and nursing note reviewed.  Constitutional:      General: He is not in acute distress.    Appearance: He is well-developed. He is not diaphoretic.     Comments: Well-appearing, comfortable, cooperative  HENT:     Head: Normocephalic and atraumatic.  Eyes:     General:        Right eye: No discharge.        Left eye: No discharge.     Conjunctiva/sclera: Conjunctivae normal.  Cardiovascular:     Rate and Rhythm: Normal rate.  Pulmonary:     Effort: Pulmonary effort is normal.  Skin:    General: Skin is warm and dry.     Findings: No erythema or rash.  Neurological:     Mental Status: He is alert and oriented to person, place, and time.  Psychiatric:        Behavior: Behavior normal.     Comments: Well groomed,  good eye contact, normal speech and thoughts    Results for orders placed or performed in visit on 08/17/16  Exercise Tolerance Test  Result Value Ref Range   Rest HR 71 bpm   Rest BP 126/78 mmHg   Exercise duration (sec) 57 sec   Percent HR 85 %   Exercise duration (min) 4 min   Estimated workload 6.9 METS   Peak HR 150 bpm   Peak BP 166/61 mmHg   MPHR 175 bpm      Assessment & Plan:   Problem List Items Addressed This Visit    Psychophysiological insomnia   Moderate episode of recurrent major depressive disorder ([facility]) - Primary   Relevant Medications   sertraline (ZOLOFT) 100 MG tablet   Generalized anxiety disorder   Relevant Medications   sertraline (ZOLOFT) 100 MG tablet      #Depression / Anxiety / Insomnia Chronic problems, seem fairly stable but still seem scoring high on PHQ/GAD but overall has improved on current SSRI therapy - Secondary insomnia, seems mostly related to C-spine DJD and physical ailments actually based on history. Seems less likely related to OSA but could pursue this in future if indicated.  Plan - Continue current Sertraline 100mg  daily, refilled. To [facility] now. - Future can reconsider Psychiatry / Counseling therapy if indicated - DC Ambien 5mg  IR - Advised he can self titrate up Gabapentin from Ortho - 300mg  q 8 hr PRN was written he only takes 300mg  QHS. I advised he can at least try x 2 = 600mg  nightly for now to help neck pain and sleep. - If needed, we can switch Zolpidem to CR dosing 6.25mg  nightly PRN in future as well, get adjusted on Gabapentin first, notify us if want to try this dosage. - I do recommend PSG and Sleep Consultation as well if not improving still.  Meds ordered this encounter  Medications  . DISCONTD: sertraline (ZOLOFT) 100 MG tablet    Sig: Take 1 tablet (100 mg total) by mouth daily.    Dispense:  90 tablet    Refill:  3  . sertraline (ZOLOFT) 100 MG tablet    Sig: Take 1 tablet (100 mg total) by mouth daily.     Dispense:  90 tablet    Refill:  3     Follow up plan: Return in about 2 weeks (around 01/17/2020) for Annual Physical + Labs.  Nobie Putnam, [facility] 01/03/2020, 10:21 AM

## 2020-01-03 NOTE — Patient Instructions (Addendum)
Thank you for coming to the office today.  Refilled Sertraline.  Start with increased Gabapentin 300mg  pills take TWO at bedtime. If tolerating well and helps sleep, let me know and can continue.  If needed for sleep - we can ADD Zolpidem (Ambien) CR - continued release overnight as well.  Future consider a Sleep Study - looking for breathing / sleep apnea issues.  DUE for FASTING BLOOD WORK (no food or drink after midnight before the lab appointment, only water or coffee without cream/sugar on the morning of)  SCHEDULE "Lab Only" visit in the morning at the clinic for lab draw in 2 to 4 WEEKS   - Make sure Lab Only appointment is at about 1 week before your next appointment, so that results will be available  For Lab Results, once available within 2-3 days of blood draw, you can can log in to MyChart online to view your results and a brief explanation. Also, we can discuss results at next follow-up visit.   Please schedule a Follow-up Appointment to: Return in about 2 weeks (around 01/17/2020) for Annual Physical + Labs.  If you have any other questions or concerns, please feel free to call the office or send a message through [place]. You may also schedule an earlier appointment if necessary.  Additionally, you may be receiving a survey about your experience at our office within a few days to 1 week by e-mail or mail. We value your feedback.  Nobie Putnam, DO [place]

## 2020-01-10 ENCOUNTER — Telehealth: Payer: Self-pay

## 2020-01-10 DIAGNOSIS — Z0000 Encounter for general adult medical examination without abnormal findings: Secondary | ICD-10-CM

## 2020-01-10 DIAGNOSIS — R351 Nocturia: Secondary | ICD-10-CM

## 2020-01-10 DIAGNOSIS — F411 Generalized anxiety disorder: Secondary | ICD-10-CM

## 2020-01-10 DIAGNOSIS — E782 Mixed hyperlipidemia: Secondary | ICD-10-CM

## 2020-01-10 DIAGNOSIS — F331 Major depressive disorder, recurrent, moderate: Secondary | ICD-10-CM

## 2020-01-10 DIAGNOSIS — R7309 Other abnormal glucose: Secondary | ICD-10-CM

## 2020-01-10 NOTE — Telephone Encounter (Signed)
Signed lab orders  Nobie Putnam, [facility] 01/10/2020, 1:28 PM

## 2020-01-13 ENCOUNTER — Other Ambulatory Visit: Payer: Medicaid Other

## 2020-01-13 ENCOUNTER — Other Ambulatory Visit: Payer: Self-pay

## 2020-01-13 DIAGNOSIS — F331 Major depressive disorder, recurrent, moderate: Secondary | ICD-10-CM | POA: Diagnosis not present

## 2020-01-13 DIAGNOSIS — R351 Nocturia: Secondary | ICD-10-CM

## 2020-01-13 DIAGNOSIS — R7309 Other abnormal glucose: Secondary | ICD-10-CM

## 2020-01-13 DIAGNOSIS — Z0000 Encounter for general adult medical examination without abnormal findings: Secondary | ICD-10-CM

## 2020-01-13 DIAGNOSIS — E782 Mixed hyperlipidemia: Secondary | ICD-10-CM

## 2020-01-14 LAB — HEMOGLOBIN A1C
Hgb A1c MFr Bld: 5.8 % of total Hgb — ABNORMAL HIGH (ref <5.7)
Mean Plasma Glucose: 120 (calc)
eAG (mmol/L): 6.6 (calc)

## 2020-01-14 LAB — LIPID PANEL
Cholesterol: 234 mg/dL — ABNORMAL HIGH (ref <200)
HDL: 32 mg/dL — ABNORMAL LOW (ref >=40)
LDL Cholesterol (Calc): 157 mg/dL (calc) — ABNORMAL HIGH
Non-HDL Cholesterol (Calc): 202 mg/dL (calc) — ABNORMAL HIGH (ref <130)
Total CHOL/HDL Ratio: 7.3 (calc) — ABNORMAL HIGH (ref <5.0)
Triglycerides: 272 mg/dL — ABNORMAL HIGH (ref <150)

## 2020-01-14 LAB — COMPLETE METABOLIC PANEL WITH GFR
AG Ratio: 1.8 (calc) (ref 1.0–2.5)
ALT: 30 U/L (ref 9–46)
AST: 14 U/L (ref 10–40)
Albumin: 4.4 g/dL (ref 3.6–5.1)
Alkaline phosphatase (APISO): 63 U/L (ref 36–130)
BUN: 12 mg/dL (ref 7–25)
CO2: 27 mmol/L (ref 20–32)
Calcium: 9.2 mg/dL (ref 8.6–10.3)
Chloride: 106 mmol/L (ref 98–110)
Creat: 0.94 mg/dL (ref 0.60–1.35)
GFR, Est African American: 110 mL/min/{1.73_m2} (ref >=60)
GFR, Est Non African American: 95 mL/min/{1.73_m2} (ref >=60)
Globulin: 2.4 g/dL (calc) (ref 1.9–3.7)
Glucose, Bld: 118 mg/dL — ABNORMAL HIGH (ref 65–99)
Potassium: 4 mmol/L (ref 3.5–5.3)
Sodium: 140 mmol/L (ref 135–146)
Total Bilirubin: 0.5 mg/dL (ref 0.2–1.2)
Total Protein: 6.8 g/dL (ref 6.1–8.1)

## 2020-01-14 LAB — CBC WITH DIFFERENTIAL/PLATELET
Absolute Monocytes: 655 cells/uL (ref 200–950)
Basophils Absolute: 101 cells/uL (ref 0–200)
Basophils Relative: 1.2 %
Eosinophils Absolute: 235 cells/uL (ref 15–500)
Eosinophils Relative: 2.8 %
HCT: 46.8 % (ref 38.5–50.0)
Hemoglobin: 16.5 g/dL (ref 13.2–17.1)
Lymphs Abs: 3007 cells/uL (ref 850–3900)
MCH: 33.7 pg — ABNORMAL HIGH (ref 27.0–33.0)
MCHC: 35.3 g/dL (ref 32.0–36.0)
MCV: 95.5 fL (ref 80.0–100.0)
MPV: 10.3 fL (ref 7.5–12.5)
Monocytes Relative: 7.8 %
Neutro Abs: 4402 cells/uL (ref 1500–7800)
Neutrophils Relative %: 52.4 %
Platelets: 232 10*3/uL (ref 140–400)
RBC: 4.9 10*6/uL (ref 4.20–5.80)
RDW: 13 % (ref 11.0–15.0)
Total Lymphocyte: 35.8 %
WBC: 8.4 10*3/uL (ref 3.8–10.8)

## 2020-01-14 LAB — PSA: PSA: 0.5 ng/mL (ref <=4.0)

## 2020-01-16 DIAGNOSIS — H5203 Hypermetropia, bilateral: Secondary | ICD-10-CM | POA: Diagnosis not present

## 2020-01-17 ENCOUNTER — Encounter: Payer: Self-pay | Admitting: Family Medicine

## 2020-01-17 ENCOUNTER — Ambulatory Visit (INDEPENDENT_AMBULATORY_CARE_PROVIDER_SITE_OTHER): Payer: Medicaid Other | Admitting: Family Medicine

## 2020-01-17 ENCOUNTER — Other Ambulatory Visit: Payer: Self-pay

## 2020-01-17 ENCOUNTER — Other Ambulatory Visit: Payer: Self-pay | Admitting: Family Medicine

## 2020-01-17 VITALS — BP 126/73 | HR 86 | Temp 97.6°F | Resp 16 | Ht 70.0 in | Wt 231.0 lb

## 2020-01-17 DIAGNOSIS — M4722 Other spondylosis with radiculopathy, cervical region: Secondary | ICD-10-CM

## 2020-01-17 DIAGNOSIS — E782 Mixed hyperlipidemia: Secondary | ICD-10-CM

## 2020-01-17 DIAGNOSIS — R7309 Other abnormal glucose: Secondary | ICD-10-CM

## 2020-01-17 DIAGNOSIS — Z0000 Encounter for general adult medical examination without abnormal findings: Secondary | ICD-10-CM

## 2020-01-17 DIAGNOSIS — G8929 Other chronic pain: Secondary | ICD-10-CM

## 2020-01-17 DIAGNOSIS — F17200 Nicotine dependence, unspecified, uncomplicated: Secondary | ICD-10-CM

## 2020-01-17 DIAGNOSIS — M542 Cervicalgia: Secondary | ICD-10-CM

## 2020-01-17 DIAGNOSIS — F411 Generalized anxiety disorder: Secondary | ICD-10-CM | POA: Diagnosis not present

## 2020-01-17 DIAGNOSIS — F331 Major depressive disorder, recurrent, moderate: Secondary | ICD-10-CM | POA: Diagnosis not present

## 2020-01-17 DIAGNOSIS — F5104 Psychophysiologic insomnia: Secondary | ICD-10-CM

## 2020-01-17 MED ORDER — GABAPENTIN 300 MG PO CAPS: 600.0000 mg | ORAL_CAPSULE | Freq: Every day | ORAL | 3 refills | Status: DC

## 2020-01-17 NOTE — Assessment & Plan Note (Signed)
Stable major depression recurrent - Still admits mild passive suicidal ideation but no active plan or other concerns - No longer w/ [facility] - Off Mirtazapine, Trazodone, Ambien  Plan Continue Sertraline 100mg  daily, has rx Follow-up in future if need return to Psych or other counseling/therapy

## 2020-01-17 NOTE — Progress Notes (Signed)
Subjective:    Patient ID: Phillip Finley, male    DOB: 30-Sep-1971, 49 y.o.   MRN: [id number]  Phillip Finley is a 49 y.o. male presenting on 01/17/2020 for Annual Exam   HPI  Here for Annual Physical and lab Review.  Elevated A1c Reports no concerns previously with PreDM or DM. No fam history of high sugar. A1c 5.8, elevated Meds: None Lifestyle: - Diet (goal to improve diet, limit sugars and starches, he is now currently still eating breads potatoes and sweets / sodas etc) - Exercise (Limited currently) Denies hypoglycemia, polyuria, visual changes, numbness or tingling.  Tobacco Abuse Active smoker. Not ready to quit. Admits more mental habitual component with still smoking.  HYPERLIPIDEMIA: - Reports no concerns. Last lipid panel 12/2019, showed elevated LDL 157, total chol 234, and low HDL high TG >200. Not following specific lifestyle, see HPI above.  PMH Chronic Depressionrecurrent - Active [name] / Insomnia: See prior note 01/03/20 Continue Sertraline. Has rx no new concerns. He never started Gabapentin back again, was miscommunication. He needs new rx Gabapentin 300mg  x 2 = 600mg  nightly now.   HM  PSA negative 0.5 12/2019  Depression screen [facility] 2/9 01/17/2020 01/03/2020 12/18/2018  Decreased Interest 1 2 2   Down, Depressed, Hopeless 1 1 1   PHQ - 2 Score 2 3 3   Altered sleeping 3 3 3   Tired, decreased energy 3 3 3   Change in appetite 1 0 0  Feeling bad or failure about yourself  0 1 1  Trouble concentrating 1 1 1   Moving slowly or fidgety/restless 1 0 1  Suicidal thoughts 1 0 1  PHQ-9 Score 12 11 13   Difficult doing work/chores Not difficult at all Not difficult at all Not difficult at all  Some recent data might be hidden   GAD 7 : Generalized Anxiety Score 01/17/2020 01/03/2020 12/18/2018 12/30/2016  Nervous, Anxious, on Edge 2 3 2 2   Control/stop worrying 1 1 2 1   Worry too much - different things 1 2 3 2   Trouble relaxing 1 2 1  0  Restless 0 3 2  1   Easily annoyed or irritable 1 1 2  0  Afraid - awful might happen 1 1 2 1   Total GAD 7 Score 7 13 14 7   Anxiety Difficulty Not difficult at all Not difficult at all Not difficult at all Not difficult at all     Past Medical History:  Diagnosis Date  . Anxiety   . Depression   . GERD (gastroesophageal reflux disease)    Past Surgical History:  Procedure Laterality Date  . NO PAST SURGERIES     Social History   Socioeconomic History  . Marital status: Married    Spouse name: Not on file  . Number of children: Not on file  . Years of education: Not on file  . Highest education level: Not on file  Occupational History  . Not on file  Tobacco Use  . Smoking status: Current Every Day Smoker    Packs/day: 2.00    Years: 30.00    Pack years: 60.00  . Smokeless tobacco: Never Used  Substance and Sexual Activity  . Alcohol use: Yes    Comment: seldom  . Drug use: No  . Sexual activity: Yes    Partners: Female    Birth control/protection: None  Other Topics Concern  . Not on file  Social History Narrative  . Not on file   Social Determinants of Health   Financial  Resource Strain:   . Difficulty of Paying Living Expenses: Not on file  Food Insecurity:   . Worried About Charity fundraiser in the Last Year: Not on file  . Ran Out of Food in the Last Year: Not on file  Transportation Needs:   . Lack of Transportation (Medical): Not on file  . Lack of Transportation (Non-Medical): Not on file  Physical Activity:   . Days of Exercise per Week: Not on file  . Minutes of Exercise per Session: Not on file  Stress:   . Feeling of Stress : Not on file  Social Connections:   . Frequency of Communication with Friends and Family: Not on file  . Frequency of Social Gatherings with Friends and Family: Not on file  . Attends Religious Services: Not on file  . Active Member of Clubs or Organizations: Not on file  . Attends Archivist Meetings: Not on file  . Marital  Status: Not on file  Intimate Partner Violence:   . Fear of Current or Ex-Partner: Not on file  . Emotionally Abused: Not on file  . Physically Abused: Not on file  . Sexually Abused: Not on file   Family History  Problem Relation Age of Onset  . Heart disease Father   . Heart attack Father 63  . Hyperlipidemia Mother   . Hypertension Mother    Current Outpatient Medications on File Prior to Visit  Medication Sig  . cyclobenzaprine (FLEXERIL) 10 MG tablet cyclobenzaprine 10 mg tablet  . esomeprazole (NEXIUM) 20 MG packet Take 20 mg by mouth daily before breakfast.  . sertraline (ZOLOFT) 100 MG tablet Take 1 tablet (100 mg total) by mouth daily.   No current facility-administered medications on file prior to visit.    Review of Systems  Constitutional: Negative for activity change, appetite change, chills, diaphoresis, fatigue and fever.  HENT: Negative for congestion and hearing loss.   Eyes: Negative for visual disturbance.  Respiratory: Negative for apnea, cough, chest tightness, shortness of breath and wheezing.   Cardiovascular: Negative for chest pain, palpitations and leg swelling.  Gastrointestinal: Negative for abdominal pain, anal bleeding, blood in stool, constipation, diarrhea, nausea and vomiting.  Endocrine: Negative for cold intolerance.  Genitourinary: Negative for decreased urine volume, difficulty urinating, dysuria, frequency and hematuria.  Musculoskeletal: Negative for arthralgias and neck pain.  Skin: Negative for rash.  Allergic/Immunologic: Negative for environmental allergies.  Neurological: Negative for dizziness, weakness, light-headedness, numbness and headaches.  Hematological: Negative for adenopathy.  Psychiatric/Behavioral: Positive for dysphoric mood and sleep disturbance. Negative for behavioral problems, self-injury and suicidal ideas. The patient is nervous/anxious.    Per HPI unless specifically indicated above     Objective:    BP  126/73   Pulse 86   Temp 97.6 F (36.4 C) (Temporal)   Resp 16   Ht 5\' 10"  (1.778 m)   Wt 231 lb (104.8 kg)   BMI 33.15 kg/m   Wt Readings from Last 3 Encounters:  01/17/20 231 lb (104.8 kg)  01/03/20 232 lb (105.2 kg)  12/19/18 240 lb 9.8 oz (109.1 kg)    Physical Exam Vitals and nursing note reviewed.  Constitutional:      General: He is not in acute distress.    Appearance: He is well-developed. He is not diaphoretic.     Comments: Well-appearing, comfortable, cooperative  HENT:     Head: Normocephalic and atraumatic.  Eyes:     General:  Right eye: No discharge.        Left eye: No discharge.     Conjunctiva/sclera: Conjunctivae normal.     Pupils: Pupils are equal, round, and reactive to light.  Neck:     Thyroid: No thyromegaly.  Cardiovascular:     Rate and Rhythm: Normal rate and regular rhythm.     Heart sounds: Normal heart sounds. No murmur.  Pulmonary:     Effort: Pulmonary effort is normal. No respiratory distress.     Breath sounds: Normal breath sounds. No wheezing or rales.  Abdominal:     General: Bowel sounds are normal. There is no distension.     Palpations: Abdomen is soft. There is no mass.     Tenderness: There is no abdominal tenderness.  Musculoskeletal:        General: No tenderness. Normal range of motion.     Cervical back: Normal range of motion and neck supple.     Comments: Upper / Lower Extremities: - Normal muscle tone, strength bilateral upper extremities 5/5, lower extremities 5/5  Lymphadenopathy:     Cervical: No cervical adenopathy.  Skin:    General: Skin is warm and dry.     Findings: No erythema or rash.  Neurological:     Mental Status: He is alert and oriented to person, place, and time.     Comments: Distal sensation intact to light touch all extremities  Psychiatric:        Behavior: Behavior normal.     Comments: Well groomed, good eye contact, normal speech and thoughts         Results for orders placed  or performed in visit on 01/13/20  Lipid panel  Result Value Ref Range   Cholesterol 234 (H) <200 mg/dL   HDL 32 (L) > OR = 40 mg/dL   Triglycerides 272 (H) <150 mg/dL   LDL Cholesterol (Calc) 157 (H) mg/dL (calc)   Total CHOL/HDL Ratio 7.3 (H) <5.0 (calc)   Non-HDL Cholesterol (Calc) 202 (H) <130 mg/dL (calc)  PSA  Result Value Ref Range   PSA 0.5 < OR = 4.0 ng/mL  Hemoglobin A1c  Result Value Ref Range   Hgb A1c MFr Bld 5.8 (H) <5.7 % of total Hgb   Mean Plasma Glucose 120 (calc)   eAG (mmol/L) 6.6 (calc)  CBC with Differential/Platelet  Result Value Ref Range   WBC 8.4 3.8 - 10.8 Thousand/uL   RBC 4.90 4.20 - 5.80 Million/uL   Hemoglobin 16.5 13.2 - 17.1 g/dL   HCT 46.8 38.5 - 50.0 %   MCV 95.5 80.0 - 100.0 fL   MCH 33.7 (H) 27.0 - 33.0 pg   MCHC 35.3 32.0 - 36.0 g/dL   RDW 13.0 11.0 - 15.0 %   Platelets 232 140 - 400 Thousand/uL   MPV 10.3 7.5 - 12.5 fL   Neutro Abs 4,402 1,500 - 7,800 cells/uL   Lymphs Abs 3,007 850 - 3,900 cells/uL   Absolute Monocytes 655 200 - 950 cells/uL   Eosinophils Absolute 235 15 - 500 cells/uL   Basophils Absolute 101 0 - 200 cells/uL   Neutrophils Relative % 52.4 %   Total Lymphocyte 35.8 %   Monocytes Relative 7.8 %   Eosinophils Relative 2.8 %   Basophils Relative 1.2 %  COMPLETE METABOLIC PANEL WITH GFR  Result Value Ref Range   Glucose, Bld 118 (H) 65 - 99 mg/dL   BUN 12 7 - 25 mg/dL   Creat 0.94 0.60 -  1.35 mg/dL   GFR, Est Non African American 95 > OR = 60 mL/min/1.56m2   GFR, Est African American 110 > OR = 60 mL/min/1.60m2   BUN/Creatinine Ratio NOT APPLICABLE 6 - 22 (calc)   Sodium 140 135 - 146 mmol/L   Potassium 4.0 3.5 - 5.3 mmol/L   Chloride 106 98 - 110 mmol/L   CO2 27 20 - 32 mmol/L   Calcium 9.2 8.6 - 10.3 mg/dL   Total Protein 6.8 6.1 - 8.1 g/dL   Albumin 4.4 3.6 - 5.1 g/dL   Globulin 2.4 1.9 - 3.7 g/dL (calc)   AG Ratio 1.8 1.0 - 2.5 (calc)   Total Bilirubin 0.5 0.2 - 1.2 mg/dL   Alkaline phosphatase (APISO)  63 36 - 130 U/L   AST 14 10 - 40 U/L   ALT 30 9 - 46 U/L      Assessment & Plan:   Problem List Items Addressed This Visit    Smoker   Psychophysiological insomnia    See A&P Depression Likely underlying secondary issue Off Trazodone, Ambien  On SSRI Increase Gabapentin from 300 at night up to 600mg  per dose - new rx      Osteoarthritis of spine with radiculopathy, cervical region   Relevant Medications   gabapentin (NEURONTIN) 300 MG capsule   Moderate episode of recurrent major depressive disorder (HCC)    Stable major depression recurrent - Still admits mild passive suicidal ideation but no active plan or other concerns - No longer w/ [facility] - Off Mirtazapine, Trazodone, Ambien  Plan Continue Sertraline 100mg  daily, has rx Follow-up in future if need return to Psych or other counseling/therapy      Hyperlipidemia    Uncontrolled cholesterol, poor lifestyle Last lipid panel 12/2019 Calculated ASCVD 10 yr risk score 6.2% but as smoker >13%  Plan: 1. Defer statin today, LDL 157 and he has potential for modifiable risk with lifestyle 2. Encourage improved lifestyle - low carb/cholesterol, reduce portion size, start regular exercise  Repeat lipids in 6 months, if still abnormal ASCVD risk or LDL >150 may consider starting low dose statin therapy as discussed      Generalized anxiety disorder   Elevated hemoglobin A1c    Mild elevated A1c up to 5.8, prior 5.4 to 5.6 range Concern with obesity, HLD  Plan:  1. Not on any therapy currently  2. Encourage improved lifestyle - low carb, low sugar diet, reduce portion size, regular exercise 3. Follow-up 6 month A1c        Other Visit Diagnoses    Annual physical exam    -  Primary   Chronic neck pain       Relevant Medications   gabapentin (NEURONTIN) 300 MG capsule      Updated Health Maintenance information Reviewed recent lab results with patient Encouraged improvement to lifestyle with diet and  exercise - Goal of weight loss  #OA/DJD Neck pain Return to Orthopedics as planned Will re order Gabapentin 300mg  x2 = 600mg  nightly   Meds ordered this encounter  Medications  . gabapentin (NEURONTIN) 300 MG capsule    Sig: Take 2 capsules (600 mg total) by mouth at bedtime.    Dispense:  180 capsule    Refill:  3      Follow up plan: Return in about 6 months (around 07/16/2020) for 6 month lab results, Cholesterol / Blood Sugar.  Future orders for A1c + Lipid, 06/2020  Nobie Putnam, [facility] 01/17/2020, 11:13 AM

## 2020-01-17 NOTE — Patient Instructions (Addendum)
Thank you for coming to the office today.  Re ordered Gabapentin to [facility], 300mg  x 2 = 600mg  nightly, new order    DUE for FASTING BLOOD WORK (no food or drink after midnight before the lab appointment, only water or coffee without cream/sugar on the morning of)  SCHEDULE "Lab Only" visit in the morning at the clinic for lab draw in 6 MONTHS   - Make sure Lab Only appointment is at about 1 week before your next appointment, so that results will be available  For Lab Results, once available within 2-3 days of blood draw, you can can log in to MyChart online to view your results and a brief explanation. Also, we can discuss results at next follow-up visit.   Please schedule a Follow-up Appointment to: Return in about 6 months (around 07/16/2020) for 6 month lab results, Cholesterol / Blood Sugar.  If you have any other questions or concerns, please feel free to call the office or send a message through [facility]. You may also schedule an earlier appointment if necessary.  Additionally, you may be receiving a survey about your experience at our office within a few days to 1 week by e-mail or mail. We value your feedback.  Nobie Putnam, DO [facility], [facility]   Heart-Healthy Eating Plan Many factors influence your heart (coronary) health, including eating and exercise habits. Coronary risk increases with abnormal blood fat (lipid) levels. Heart-healthy meal planning includes limiting unhealthy fats, increasing healthy fats, and making other diet and lifestyle changes. What is my plan? Your health care provider may recommend that you:  Limit your fat intake to _________% or less of your total calories each day.  Limit your saturated fat intake to _________% or less of your total calories each day.  Limit the amount of cholesterol in your diet to less than _________ mg per day. What are tips for following this plan? Cooking Cook foods using methods other than  frying. Baking, boiling, grilling, and broiling are all good options. Other ways to reduce fat include:  Removing the skin from poultry.  Removing all visible fats from meats.  Steaming vegetables in water or broth. Meal planning   At meals, imagine dividing your plate into fourths: ? Fill one-half of your plate with vegetables and green salads. ? Fill one-fourth of your plate with whole grains. ? Fill one-fourth of your plate with lean protein foods.  Eat 4-5 servings of vegetables per day. One serving equals 1 cup raw or cooked vegetable, or 2 cups raw leafy greens.  Eat 4-5 servings of fruit per day. One serving equals 1 medium whole fruit,  cup dried fruit,  cup fresh, frozen, or canned fruit, or  cup 100% fruit juice.  Eat more foods that contain soluble fiber. Examples include apples, broccoli, carrots, beans, peas, and barley. Aim to get 25-30 g of fiber per day.  Increase your consumption of legumes, nuts, and seeds to 4-5 servings per week. One serving of dried beans or legumes equals  cup cooked, 1 serving of nuts is  cup, and 1 serving of seeds equals 1 tablespoon. Fats  Choose healthy fats more often. Choose monounsaturated and polyunsaturated fats, such as olive and canola oils, flaxseeds, walnuts, almonds, and seeds.  Eat more omega-3 fats. Choose salmon, mackerel, sardines, tuna, flaxseed oil, and ground flaxseeds. Aim to eat fish at least 2 times each week.  Check food labels carefully to identify foods with trans fats or high amounts of saturated  fat.  Limit saturated fats. These are found in animal products, such as meats, butter, and cream. Plant sources of saturated fats include palm oil, palm kernel oil, and coconut oil.  Avoid foods with partially hydrogenated oils in them. These contain trans fats. Examples are stick margarine, some tub margarines, cookies, crackers, and other baked goods.  Avoid fried foods. General information  Eat more home-cooked  food and less restaurant, buffet, and fast food.  Limit or avoid alcohol.  Limit foods that are high in starch and sugar.  Lose weight if you are overweight. Losing just 5-10% of your body weight can help your overall health and prevent diseases such as diabetes and heart disease.  Monitor your salt (sodium) intake, especially if you have high blood pressure. Talk with your health care provider about your sodium intake.  Try to incorporate more vegetarian meals weekly. What foods can I eat? Fruits All fresh, canned (in natural juice), or frozen fruits. Vegetables Fresh or frozen vegetables (raw, steamed, roasted, or grilled). Green salads. Grains Most grains. Choose whole wheat and whole grains most of the time. Rice and pasta, including brown rice and pastas made with whole wheat. Meats and other proteins Lean, well-trimmed beef, veal, pork, and lamb. Chicken and [place] without skin. All fish and shellfish. Wild duck, rabbit, pheasant, and venison. Egg whites or low-cholesterol egg substitutes. Dried beans, peas, lentils, and tofu. Seeds and most nuts. Dairy Low-fat or nonfat cheeses, including ricotta and mozzarella. Skim or 1% milk (liquid, powdered, or evaporated). Buttermilk made with low-fat milk. Nonfat or low-fat yogurt. Fats and oils Non-hydrogenated (trans-free) margarines. Vegetable oils, including soybean, sesame, sunflower, olive, peanut, safflower, corn, canola, and cottonseed. Salad dressings or mayonnaise made with a vegetable oil. Beverages Water (mineral or sparkling). Coffee and tea. Diet carbonated beverages. Sweets and desserts Sherbet, gelatin, and fruit ice. Small amounts of dark chocolate. Limit all sweets and desserts. Seasonings and condiments All seasonings and condiments. The items listed above may not be a complete list of foods and beverages you can eat. Contact a dietitian for more options. What foods are not recommended? Fruits Canned fruit in heavy  syrup. Fruit in cream or butter sauce. Fried fruit. Limit coconut. Vegetables Vegetables cooked in cheese, cream, or butter sauce. Fried vegetables. Grains Breads made with saturated or trans fats, oils, or whole milk. Croissants. Sweet rolls. Donuts. High-fat crackers, such as cheese crackers. Meats and other proteins Fatty meats, such as hot dogs, ribs, sausage, bacon, rib-eye roast or steak. High-fat deli meats, such as salami and bologna. Caviar. Domestic duck and goose. Organ meats, such as liver. Dairy Cream, sour cream, cream cheese, and creamed cottage cheese. Whole milk cheeses. Whole or 2% milk (liquid, evaporated, or condensed). Whole buttermilk. Cream sauce or high-fat cheese sauce. Whole-milk yogurt. Fats and oils Meat fat, or shortening. Cocoa butter, hydrogenated oils, palm oil, coconut oil, palm kernel oil. Solid fats and shortenings, including bacon fat, salt pork, lard, and butter. Nondairy cream substitutes. Salad dressings with cheese or sour cream. Beverages Regular sodas and any drinks with added sugar. Sweets and desserts Frosting. Pudding. Cookies. Cakes. Pies. Milk chocolate or white chocolate. Buttered syrups. Full-fat ice cream or ice cream drinks. The items listed above may not be a complete list of foods and beverages to avoid. Contact a dietitian for more information. Summary  Heart-healthy meal planning includes limiting unhealthy fats, increasing healthy fats, and making other diet and lifestyle changes.  Lose weight if you are overweight. Losing just 5-10%  of your body weight can help your overall health and prevent diseases such as diabetes and heart disease.  Focus on eating a balance of foods, including fruits and vegetables, low-fat or nonfat dairy, lean protein, nuts and legumes, whole grains, and heart-healthy oils and fats. This information is not intended to replace advice given to you by your health care provider. Make sure you discuss any questions  you have with your health care provider. Document Revised: 12/15/2017 Document Reviewed: 12/15/2017 Elsevier Patient Education  2020 [employer].

## 2020-01-17 NOTE — Assessment & Plan Note (Signed)
See A&P Depression Likely underlying secondary issue Off Trazodone, Ambien  On SSRI Increase Gabapentin from 300 at night up to 600mg  per dose - new rx

## 2020-01-17 NOTE — Assessment & Plan Note (Signed)
Uncontrolled cholesterol, poor lifestyle Last lipid panel 12/2019 Calculated ASCVD 10 yr risk score 6.2% but as smoker >13%  Plan: 1. Defer statin today, LDL 157 and he has potential for modifiable risk with lifestyle 2. Encourage improved lifestyle - low carb/cholesterol, reduce portion size, start regular exercise  Repeat lipids in 6 months, if still abnormal ASCVD risk or LDL >150 may consider starting low dose statin therapy as discussed

## 2020-01-17 NOTE — Assessment & Plan Note (Signed)
Mild elevated A1c up to 5.8, prior 5.4 to 5.6 range Concern with obesity, HLD  Plan:  1. Not on any therapy currently  2. Encourage improved lifestyle - low carb, low sugar diet, reduce portion size, regular exercise 3. Follow-up 6 month A1c

## 2020-01-22 DIAGNOSIS — H5213 Myopia, bilateral: Secondary | ICD-10-CM | POA: Diagnosis not present

## 2020-02-25 DIAGNOSIS — H5203 Hypermetropia, bilateral: Secondary | ICD-10-CM | POA: Diagnosis not present

## 2020-08-21 ENCOUNTER — Ambulatory Visit: Payer: Medicaid Other | Admitting: Family Medicine

## 2020-08-24 ENCOUNTER — Encounter: Payer: Self-pay | Admitting: Family Medicine

## 2020-08-24 ENCOUNTER — Other Ambulatory Visit: Payer: Self-pay

## 2020-08-24 ENCOUNTER — Other Ambulatory Visit: Payer: Self-pay | Admitting: Family Medicine

## 2020-08-24 ENCOUNTER — Ambulatory Visit (INDEPENDENT_AMBULATORY_CARE_PROVIDER_SITE_OTHER): Payer: Medicaid Other | Admitting: Family Medicine

## 2020-08-24 DIAGNOSIS — F411 Generalized anxiety disorder: Secondary | ICD-10-CM

## 2020-08-24 DIAGNOSIS — Z0000 Encounter for general adult medical examination without abnormal findings: Secondary | ICD-10-CM

## 2020-08-24 DIAGNOSIS — M4722 Other spondylosis with radiculopathy, cervical region: Secondary | ICD-10-CM

## 2020-08-24 DIAGNOSIS — G8929 Other chronic pain: Secondary | ICD-10-CM

## 2020-08-24 DIAGNOSIS — F331 Major depressive disorder, recurrent, moderate: Secondary | ICD-10-CM

## 2020-08-24 DIAGNOSIS — E782 Mixed hyperlipidemia: Secondary | ICD-10-CM

## 2020-08-24 DIAGNOSIS — R7309 Other abnormal glucose: Secondary | ICD-10-CM

## 2020-08-24 DIAGNOSIS — Z125 Encounter for screening for malignant neoplasm of prostate: Secondary | ICD-10-CM

## 2020-08-24 DIAGNOSIS — M542 Cervicalgia: Secondary | ICD-10-CM | POA: Diagnosis not present

## 2020-08-24 DIAGNOSIS — Z1159 Encounter for screening for other viral diseases: Secondary | ICD-10-CM

## 2020-08-24 MED ORDER — SERTRALINE HCL 100 MG PO TABS: 100.0000 mg | ORAL_TABLET | Freq: Every day | ORAL | 3 refills | Status: DC

## 2020-08-24 MED ORDER — GABAPENTIN 300 MG PO CAPS: 600.0000 mg | ORAL_CAPSULE | Freq: Every day | ORAL | 3 refills | Status: DC

## 2020-08-24 NOTE — Assessment & Plan Note (Signed)
Stable chronic problem Followed by Ortho On Gabapentin, refill today Future may warrant surgical intervention

## 2020-08-24 NOTE — Progress Notes (Signed)
Subjective:    Patient ID: Phillip Finley, male    DOB: 05-May-1971, 49 y.o.   MRN: [id number]  Phillip Finley is a 49 y.o. male presenting on 08/24/2020 for Anxiety   HPI   Elevated A1c Reports no concerns previously with PreDM or DM. No fam history of high sugar. A1c 5.8, elevated in 12/2019 Meds: None Lifestyle:  - Diet (goal to improve diet) - Exercise (Limited currently) Denies hypoglycemia, polyuria, visual changes, numbness or tingling.  HYPERLIPIDEMIA: - Reports no concerns. Last lipid panel 12/2019, showed elevated LDL 157, total chol 234, and low HDL high TG >200. Not following specific lifestyle, see HPI above. Did not return for lab within 6 month, today asking for lab testing.  Chronic Depressionrecurrent - Active Phillip Finley / Insomnia: See prior note 01/03/20 Continue Sertraline. Has rx no new concerns.  Chronic Neck pain / OA DJD He has chronic problem. Reduced ROM On Gabapentin 300mg  x 2 = 600mg  nightly, needs refill He said ortho, want to do surgery he is hesitant  Skin spots on lower extremity  Health Maintenance:  Due for Flu Shot, declines today despite counseling on benefits    Depression screen [facility] 2/9 08/24/2020 01/17/2020 01/03/2020  Decreased Interest 1 1 2   Down, Depressed, Hopeless 2 1 1   PHQ - 2 Score 3 2 3   Altered sleeping 2 3 3   Tired, decreased energy 3 3 3   Change in appetite 0 1 0  Feeling bad or failure about yourself  1 0 1  Trouble concentrating 2 1 1   Moving slowly or fidgety/restless 0 1 0  Suicidal thoughts 1 1 0  PHQ-9 Score 12 12 11   Difficult doing work/chores Somewhat difficult Not difficult at all Not difficult at all  Some recent data might be hidden   [place]-Suicide Severity Rating Scale 1) Have you wished you were dead or wished you could go to sleep and not wake up? - Yes  2) Have you had any actual thoughts of killing yourself? - No  Skip questions 3,4, 5  6) Have you ever done anything, started to do  anything, or prepared to do anything to end your life? - No   GAD 7 : Generalized Anxiety Score 08/24/2020 01/17/2020 01/03/2020 12/18/2018  Nervous, Anxious, on Edge 3 2 3 2   Control/stop worrying 1 1 1 2   Worry too much - different things 2 1 2 3   Trouble relaxing 1 1 2 1   Restless 2 0 3 2  Easily annoyed or irritable 2 1 1 2   Afraid - awful might happen 2 1 1 2   Total GAD 7 Score 13 7 13 14   Anxiety Difficulty Not difficult at all Not difficult at all Not difficult at all Not difficult at all     Social History   Tobacco Use  . Smoking status: Current Every Day Smoker    Packs/day: 2.00    Years: 30.00    Pack years: 60.00  . Smokeless tobacco: Never Used  Substance Use Topics  . Alcohol use: Yes    Comment: seldom  . Drug use: No    Review of Systems Per HPI unless specifically indicated above     Objective:    BP 125/70   Pulse 70   Temp (!) 97.3 F (36.3 C) (Temporal)   Resp 16   Ht 5\' 10"  (1.778 m)   Wt 223 lb 9.6 oz (101.4 kg)   SpO2 99%   BMI 32.08 kg/m   Wt  Readings from Last 3 Encounters:  08/24/20 223 lb 9.6 oz (101.4 kg)  01/17/20 231 lb (104.8 kg)  01/03/20 232 lb (105.2 kg)    Physical Exam Vitals and nursing note reviewed.  Constitutional:      General: He is not in acute distress.    Appearance: He is well-developed. He is not diaphoretic.     Comments: Well-appearing, comfortable, cooperative  HENT:     Head: Normocephalic and atraumatic.  Eyes:     General:        Right eye: No discharge.        Left eye: No discharge.     Conjunctiva/sclera: Conjunctivae normal.  Cardiovascular:     Rate and Rhythm: Normal rate.  Pulmonary:     Effort: Pulmonary effort is normal.  Skin:    General: Skin is warm and dry.     Findings: No erythema or rash.     Comments: Multiple SKs on lower extremity  Neurological:     Mental Status: He is alert and oriented to person, place, and time.  Psychiatric:        Behavior: Behavior normal.      Comments: Well groomed, good eye contact, normal speech and thoughts    Results for orders placed or performed in visit on 01/13/20  Lipid panel  Result Value Ref Range   Cholesterol 234 (H) <200 mg/dL   HDL 32 (L) > OR = 40 mg/dL   Triglycerides 272 (H) <150 mg/dL   LDL Cholesterol (Calc) 157 (H) mg/dL (calc)   Total CHOL/HDL Ratio 7.3 (H) <5.0 (calc)   Non-HDL Cholesterol (Calc) 202 (H) <130 mg/dL (calc)  PSA  Result Value Ref Range   PSA 0.5 < OR = 4.0 ng/mL  Hemoglobin A1c  Result Value Ref Range   Hgb A1c MFr Bld 5.8 (H) <5.7 % of total Hgb   Mean Plasma Glucose 120 (calc)   eAG (mmol/L) 6.6 (calc)  CBC with Differential/Platelet  Result Value Ref Range   WBC 8.4 3.8 - 10.8 Thousand/uL   RBC 4.90 4.20 - 5.80 Million/uL   Hemoglobin 16.5 13.2 - 17.1 g/dL   HCT 46.8 38 - 50 %   MCV 95.5 80.0 - 100.0 fL   MCH 33.7 (H) 27.0 - 33.0 pg   MCHC 35.3 32.0 - 36.0 g/dL   RDW 13.0 11.0 - 15.0 %   Platelets 232 140 - 400 Thousand/uL   MPV 10.3 7.5 - 12.5 fL   Neutro Abs 4,402 1,500 - 7,800 cells/uL   Lymphs Abs 3,007 850 - 3,900 cells/uL   Absolute Monocytes 655 200 - 950 cells/uL   Eosinophils Absolute 235 15 - 500 cells/uL   Basophils Absolute 101 0 - 200 cells/uL   Neutrophils Relative % 52.4 %   Total Lymphocyte 35.8 %   Monocytes Relative 7.8 %   Eosinophils Relative 2.8 %   Basophils Relative 1.2 %  COMPLETE METABOLIC PANEL WITH GFR  Result Value Ref Range   Glucose, Bld 118 (H) 65 - 99 mg/dL   BUN 12 7 - 25 mg/dL   Creat 0.94 0.60 - 1.35 mg/dL   GFR, Est Non African American 95 > OR = 60 mL/min/1.28m2   GFR, Est African American 110 > OR = 60 mL/min/1.62m2   BUN/Creatinine Ratio NOT APPLICABLE 6 - 22 (calc)   Sodium 140 135 - 146 mmol/L   Potassium 4.0 3.5 - 5.3 mmol/L   Chloride 106 98 - 110 mmol/L   CO2 27  20 - 32 mmol/L   Calcium 9.2 8.6 - 10.3 mg/dL   Total Protein 6.8 6.1 - 8.1 g/dL   Albumin 4.4 3.6 - 5.1 g/dL   Globulin 2.4 1.9 - 3.7 g/dL (calc)   AG  Ratio 1.8 1.0 - 2.5 (calc)   Total Bilirubin 0.5 0.2 - 1.2 mg/dL   Alkaline phosphatase (APISO) 63 36 - 130 U/L   AST 14 10 - 40 U/L   ALT 30 9 - 46 U/L      Assessment & Plan:   Problem List Items Addressed This Visit    Osteoarthritis of spine with radiculopathy, cervical region    Stable chronic problem Followed by Ortho On Gabapentin, refill today Future may warrant surgical intervention      Relevant Medications   sertraline (ZOLOFT) 100 MG tablet   gabapentin (NEURONTIN) 300 MG capsule   Moderate episode of recurrent major depressive disorder (HCC)    Stable major depression recurrent - Still admits mild passive suicidal ideation but no active plan or other concerns - No longer w/ [facility] Psychiat[facility] - Off Mirtazapine, Trazodone, Ambien  Plan Continue Sertraline 100mg  daily add refills Follow-up in future if need return to Psych or other counseling/therapy      Relevant Medications   sertraline (ZOLOFT) 100 MG tablet   Generalized anxiety disorder    See A&P MDD      Relevant Medications   sertraline (ZOLOFT) 100 MG tablet    Other Visit Diagnoses    Chronic neck pain       Relevant Medications   sertraline (ZOLOFT) 100 MG tablet   gabapentin (NEURONTIN) 300 MG capsule      Meds ordered this encounter  Medications  . sertraline (ZOLOFT) 100 MG tablet    Sig: Take 1 tablet (100 mg total) by mouth daily.    Dispense:  90 tablet    Refill:  3  . gabapentin (NEURONTIN) 300 MG capsule    Sig: Take 2 capsules (600 mg total) by mouth at bedtime.    Dispense:  180 capsule    Refill:  3      Follow up plan: Return in about 5 months (around 01/22/2021) for Follow-up 5 month fasting lab only then 1 week later Annual Physical.  Future labs ordered for 01/19/21 approx   Nobie Putnam, DO [facility] 08/24/2020, 11:47 AM

## 2020-08-24 NOTE — Patient Instructions (Addendum)
Thank you for coming to the office today.  Refilled Sertraline and Gabapentin.  Seborrheic keratosis (seb-o-REE-ik care-uh-TOE-sis) is a common skin growth. It may seem worrisome because it can look like a wart, pre-cancerous skin growth (actinic keratosis), or skin cancer. Despite their appearance, seborrheic keratoses are harmless. Most people get these growths when they are middle aged or older. Because they begin at a later age and can have a wart-like appearance, seborrheic keratoses are often called the "barnacles of aging." It's possible to have just one of these growths, but most people develop several. Some growths may have a warty surface while others look like dabs of warm, brown candle wax on the skin. Seborrheic keratoses range in color from white to black; however, most are tan or brown. You can find these harmless growths anywhere on the skin, except the palms and soles. Most often, you'll see them on the chest, back, head, or neck.   DUE for FASTING BLOOD WORK (no food or drink after midnight before the lab appointment, only water or coffee without cream/sugar on the morning of)  SCHEDULE "Lab Only" visit in the morning at the clinic for lab draw in  5 MONTHS   - Make sure Lab Only appointment is at about 1 week before your next appointment, so that results will be available  For Lab Results, once available within 2-3 days of blood draw, you can can log in to MyChart online to view your results and a brief explanation. Also, we can discuss results at next follow-up visit.    Please schedule a Follow-up Appointment to: Return in about 5 months (around 01/22/2021) for Follow-up 5 month fasting lab only then 1 week later Annual Physical.  If you have any other questions or concerns, please feel free to call the office or send a message through [place]. You may also schedule an earlier appointment if necessary.  Additionally, you may be receiving a survey about your experience at  our office within a few days to 1 week by e-mail or mail. We value your feedback.  Nobie Putnam, DO [place]

## 2020-08-24 NOTE — Assessment & Plan Note (Signed)
See A&P MDD

## 2020-08-24 NOTE — Assessment & Plan Note (Signed)
Stable major depression recurrent - Still admits mild passive suicidal ideation but no active plan or other concerns - No longer w/ [facility] Psychiatry - Off Mirtazapine, Trazodone, Ambien  Plan Continue Sertraline 100mg  daily add refills Follow-up in future if need return to Psych or other counseling/therapy

## 2021-01-18 ENCOUNTER — Other Ambulatory Visit: Payer: Self-pay | Admitting: *Deleted

## 2021-01-18 DIAGNOSIS — F411 Generalized anxiety disorder: Secondary | ICD-10-CM

## 2021-01-18 DIAGNOSIS — Z125 Encounter for screening for malignant neoplasm of prostate: Secondary | ICD-10-CM

## 2021-01-18 DIAGNOSIS — Z1159 Encounter for screening for other viral diseases: Secondary | ICD-10-CM

## 2021-01-18 DIAGNOSIS — E782 Mixed hyperlipidemia: Secondary | ICD-10-CM

## 2021-01-18 DIAGNOSIS — R7309 Other abnormal glucose: Secondary | ICD-10-CM

## 2021-01-18 DIAGNOSIS — Z0000 Encounter for general adult medical examination without abnormal findings: Secondary | ICD-10-CM

## 2021-01-18 DIAGNOSIS — F331 Major depressive disorder, recurrent, moderate: Secondary | ICD-10-CM

## 2021-01-19 ENCOUNTER — Other Ambulatory Visit: Payer: Medicaid Other

## 2021-01-20 ENCOUNTER — Other Ambulatory Visit: Payer: Medicaid Other

## 2021-01-20 ENCOUNTER — Other Ambulatory Visit: Payer: Self-pay

## 2021-01-20 DIAGNOSIS — R7309 Other abnormal glucose: Secondary | ICD-10-CM | POA: Diagnosis not present

## 2021-01-20 DIAGNOSIS — E782 Mixed hyperlipidemia: Secondary | ICD-10-CM | POA: Diagnosis not present

## 2021-01-20 DIAGNOSIS — Z1159 Encounter for screening for other viral diseases: Secondary | ICD-10-CM | POA: Diagnosis not present

## 2021-01-20 DIAGNOSIS — Z0000 Encounter for general adult medical examination without abnormal findings: Secondary | ICD-10-CM | POA: Diagnosis not present

## 2021-01-20 DIAGNOSIS — F331 Major depressive disorder, recurrent, moderate: Secondary | ICD-10-CM | POA: Diagnosis not present

## 2021-01-20 DIAGNOSIS — Z125 Encounter for screening for malignant neoplasm of prostate: Secondary | ICD-10-CM | POA: Diagnosis not present

## 2021-01-20 DIAGNOSIS — F411 Generalized anxiety disorder: Secondary | ICD-10-CM | POA: Diagnosis not present

## 2021-01-20 LAB — COMPLETE METABOLIC PANEL WITH GFR
Albumin: 4.2 g/dL (ref 3.6–5.1)
GFR, Est African American: 111 mL/min/{1.73_m2} (ref >=60)
GFR, Est Non African American: 95 mL/min/{1.73_m2} (ref >=60)
Total Bilirubin: 0.5 mg/dL (ref 0.2–1.2)
Total Protein: 6.3 g/dL (ref 6.1–8.1)

## 2021-01-20 LAB — LIPID PANEL
Cholesterol: 203 mg/dL — ABNORMAL HIGH (ref <200)
LDL Cholesterol (Calc): 120 mg/dL (calc) — ABNORMAL HIGH
Non-HDL Cholesterol (Calc): 174 mg/dL (calc) — ABNORMAL HIGH (ref <130)

## 2021-01-20 LAB — CBC WITH DIFFERENTIAL/PLATELET: Monocytes Relative: 8.7 %

## 2021-01-21 LAB — HEPATITIS C ANTIBODY
Hepatitis C Ab: NONREACTIVE
SIGNAL TO CUT-OFF: 0.01 (ref <1.00)

## 2021-01-21 LAB — COMPLETE METABOLIC PANEL WITH GFR
AG Ratio: 2 (calc) (ref 1.0–2.5)
ALT: 30 U/L (ref 9–46)
AST: 17 U/L (ref 10–35)
Alkaline phosphatase (APISO): 58 U/L (ref 35–144)
BUN: 11 mg/dL (ref 7–25)
CO2: 25 mmol/L (ref 20–32)
Calcium: 9.2 mg/dL (ref 8.6–10.3)
Chloride: 108 mmol/L (ref 98–110)
Creat: 0.93 mg/dL (ref 0.70–1.33)
Globulin: 2.1 g/dL (calc) (ref 1.9–3.7)
Glucose, Bld: 107 mg/dL — ABNORMAL HIGH (ref 65–99)
Potassium: 3.9 mmol/L (ref 3.5–5.3)
Sodium: 139 mmol/L (ref 135–146)

## 2021-01-21 LAB — CBC WITH DIFFERENTIAL/PLATELET
Absolute Monocytes: 696 cells/uL (ref 200–950)
Basophils Absolute: 56 cells/uL (ref 0–200)
Basophils Relative: 0.7 %
Eosinophils Absolute: 232 cells/uL (ref 15–500)
Eosinophils Relative: 2.9 %
HCT: 45.8 % (ref 38.5–50.0)
Hemoglobin: 16.2 g/dL (ref 13.2–17.1)
Lymphs Abs: 3560 cells/uL (ref 850–3900)
MCH: 33.6 pg — ABNORMAL HIGH (ref 27.0–33.0)
MCHC: 35.4 g/dL (ref 32.0–36.0)
MCV: 95 fL (ref 80.0–100.0)
MPV: 10.1 fL (ref 7.5–12.5)
Neutro Abs: 3456 cells/uL (ref 1500–7800)
Neutrophils Relative %: 43.2 %
Platelets: 235 10*3/uL (ref 140–400)
RBC: 4.82 10*6/uL (ref 4.20–5.80)
RDW: 12.5 % (ref 11.0–15.0)
Total Lymphocyte: 44.5 %
WBC: 8 10*3/uL (ref 3.8–10.8)

## 2021-01-21 LAB — HEMOGLOBIN A1C
Hgb A1c MFr Bld: 5.7 % of total Hgb — ABNORMAL HIGH (ref <5.7)
Mean Plasma Glucose: 117 mg/dL
eAG (mmol/L): 6.5 mmol/L

## 2021-01-21 LAB — PSA: PSA: 0.54 ng/mL (ref <=4.0)

## 2021-01-21 LAB — LIPID PANEL
HDL: 29 mg/dL — ABNORMAL LOW (ref >=40)
Total CHOL/HDL Ratio: 7 (calc) — ABNORMAL HIGH (ref <5.0)
Triglycerides: 375 mg/dL — ABNORMAL HIGH (ref <150)

## 2021-01-21 LAB — TSH: TSH: 2.17 mIU/L (ref 0.40–4.50)

## 2021-01-25 ENCOUNTER — Other Ambulatory Visit: Payer: Self-pay | Admitting: Family Medicine

## 2021-01-25 ENCOUNTER — Other Ambulatory Visit: Payer: Self-pay

## 2021-01-25 ENCOUNTER — Encounter: Payer: Self-pay | Admitting: Family Medicine

## 2021-01-25 ENCOUNTER — Ambulatory Visit (INDEPENDENT_AMBULATORY_CARE_PROVIDER_SITE_OTHER): Payer: Medicaid Other | Admitting: Family Medicine

## 2021-01-25 VITALS — BP 117/63 | HR 66 | Temp 97.5°F | Ht 70.0 in | Wt 223.8 lb

## 2021-01-25 DIAGNOSIS — F411 Generalized anxiety disorder: Secondary | ICD-10-CM | POA: Diagnosis not present

## 2021-01-25 DIAGNOSIS — Z1211 Encounter for screening for malignant neoplasm of colon: Secondary | ICD-10-CM

## 2021-01-25 DIAGNOSIS — Z0000 Encounter for general adult medical examination without abnormal findings: Secondary | ICD-10-CM

## 2021-01-25 DIAGNOSIS — F3341 Major depressive disorder, recurrent, in partial remission: Secondary | ICD-10-CM

## 2021-01-25 DIAGNOSIS — R7309 Other abnormal glucose: Secondary | ICD-10-CM | POA: Diagnosis not present

## 2021-01-25 DIAGNOSIS — E782 Mixed hyperlipidemia: Secondary | ICD-10-CM

## 2021-01-25 DIAGNOSIS — E669 Obesity, unspecified: Secondary | ICD-10-CM

## 2021-01-25 DIAGNOSIS — K2270 Barrett's esophagus without dysplasia: Secondary | ICD-10-CM | POA: Diagnosis not present

## 2021-01-25 DIAGNOSIS — F5104 Psychophysiologic insomnia: Secondary | ICD-10-CM | POA: Diagnosis not present

## 2021-01-25 DIAGNOSIS — E66811 Obesity, class 1: Secondary | ICD-10-CM

## 2021-01-25 DIAGNOSIS — E663 Overweight: Secondary | ICD-10-CM | POA: Insufficient documentation

## 2021-01-25 NOTE — Progress Notes (Signed)
Subjective:    Patient ID: Phillip Finley, male    DOB: Jul 24, 1971, 50 y.o.   MRN: [id number]  Phillip Finley is a 50 y.o. male presenting on 01/25/2021 for Annual Exam (Pt has a few questions related to endoscopy.)   HPI   Here for Annual Physical and Lab Review.  Elevated A1c Prior readings 5.6 to 5.8, now lab showed 5.7, he admits drinking soda and other things that can raise sugar. He has goal to improve this. Meds:None Lifestyle:  - Diet (goal to improve diet - he said he plans to stop drinking soda) he has done this before and lost weight. - Exercise (Limited currently) Denies hypoglycemia, polyuria, visual changes, numbness or tingling.  HYPERLIPIDEMIA: - Reports no concerns. Last lipid panel3/2022 - LDL down from 150s down to 120. High TG >350 Not on statin therapy.  Chronic Neck pain / OA DJD He has chronic problem. Reduced ROM Previously - on Gabapentin 300mg  x 2 = 600mg  nightly, needs refill He said ortho, want to do surgery he is hesitant  Chronic Depressionrecurrent - remission Anxiety / Insomnia: He has weaned down off of Sertraline 100mg  daily over past >1-2 months. Now off medication and doing well. He also took Gabapentin 300mg  x 2 = 600mg  nightly for nec issue and also with insomnia.  Additional history  GERD / reflux Needs referral to GI for Upper GI - Not taking Esomeprazole 20mg  daily  Health Maintenance:  Hep C routine screen negative.  PSA 0.54 (01/2021) negative.  Colon CA Screening: Never had colonoscopy. Currently asymptomatic. No known family history of colon CA. Due for screening test new referral to GI for colonoscopy    Depression screen [facility] 2/9 01/25/2021 08/24/2020 01/17/2020  Decreased Interest 1 1 1   Down, Depressed, Hopeless 1 2 1   PHQ - 2 Score 2 3 2   Altered sleeping 3 2 3   Tired, decreased energy 3 3 3   Change in appetite 2 0 1  Feeling bad or failure about yourself  1 1 0  Trouble concentrating 2 2 1   Moving  slowly or fidgety/restless 1 0 1  Suicidal thoughts 0 1 1  PHQ-9 Score 14 12 12   Difficult doing work/chores Somewhat difficult Somewhat difficult Not difficult at all  Some recent data might be hidden   GAD 7 : Generalized Anxiety Score 01/25/2021 08/24/2020 01/17/2020 01/03/2020  Nervous, Anxious, on Edge 1 3 2 3   Control/stop worrying 1 1 1 1   Worry too much - different things 2 2 1 2   Trouble relaxing 1 1 1 2   Restless 1 2 0 3  Easily annoyed or irritable 1 2 1 1   Afraid - awful might happen 2 2 1 1   Total GAD 7 Score 9 13 7 13   Anxiety Difficulty Somewhat difficult Not difficult at all Not difficult at all Not difficult at all      Past Medical History:  Diagnosis Date   Anxiety    Depression    GERD (gastroesophageal reflux disease)    Past Surgical History:  Procedure Laterality Date   NO PAST SURGERIES     Social History   Socioeconomic History   Marital status: Married    Spouse name: Not on file   Number of children: Not on file   Years of education: Not on file   Highest education level: Not on file  Occupational History   Not on file  Tobacco Use   Smoking status: Current Every Day Smoker  Packs/day: 2.00    Years: 30.00    Pack years: 60.00   Smokeless tobacco: Never Used  Substance and Sexual Activity   Alcohol use: Yes    Comment: seldom   Drug use: No   Sexual activity: Yes    Partners: Female    Birth control/protection: None  Other Topics Concern   Not on file  Social History Narrative   Not on file   Social Determinants of Health   Financial Resource Strain: Not on file  Food Insecurity: Not on file  Transportation Needs: Not on file  Physical Activity: Not on file  Stress: Not on file  Social Connections: Not on file  Intimate Partner Violence: Not on file   Family History  Problem Relation Age of Onset   Heart disease Father    Heart attack Father 61   Hyperlipidemia Mother    Hypertension Mother     Current Outpatient Medications on File Prior to Visit  Medication Sig   esomeprazole (NEXIUM) 20 MG packet Take 20 mg by mouth daily before breakfast.   No current facility-administered medications on file prior to visit.    Review of Systems  Constitutional: Negative for activity change, appetite change, chills, diaphoresis, fatigue and fever.  HENT: Negative for congestion and hearing loss.   Eyes: Negative for visual disturbance.  Respiratory: Negative for cough, chest tightness, shortness of breath and wheezing.   Cardiovascular: Negative for chest pain, palpitations and leg swelling.  Gastrointestinal: Negative for abdominal pain, constipation, diarrhea, nausea and vomiting.  Endocrine: Negative for cold intolerance.  Genitourinary: Negative for dysuria, frequency and hematuria.  Musculoskeletal: Negative for arthralgias and neck pain.  Skin: Negative for rash.  Allergic/Immunologic: Negative for environmental allergies.  Neurological: Negative for dizziness, weakness, light-headedness, numbness and headaches.  Hematological: Negative for adenopathy.  Psychiatric/Behavioral: Negative for behavioral problems, dysphoric mood and sleep disturbance.   Per HPI unless specifically indicated above     Objective:    BP 117/63 (BP Location: Left Arm, Patient Position: Sitting, Cuff Size: Normal)    Pulse 66    Temp (!) 97.5 F (36.4 C) (Temporal)    Ht 5\' 10"  (1.778 m)    Wt 223 lb 12.8 oz (101.5 kg)    SpO2 98%    BMI 32.11 kg/m   Wt Readings from Last 3 Encounters:  01/25/21 223 lb 12.8 oz (101.5 kg)  08/24/20 223 lb 9.6 oz (101.4 kg)  01/17/20 231 lb (104.8 kg)    Physical Exam Vitals and nursing note reviewed.  Constitutional:      General: He is not in acute distress.    Appearance: He is well-developed and well-nourished. He is not diaphoretic.     Comments: Well-appearing, comfortable, cooperative  HENT:     Head: Normocephalic and atraumatic.     Right Ear:  Tympanic membrane, ear canal and external ear normal. There is no impacted cerumen.     Left Ear: Tympanic membrane, ear canal and external ear normal. There is no impacted cerumen.     Mouth/Throat:     Mouth: Oropharynx is clear and moist.  Eyes:     General:        Right eye: No discharge.        Left eye: No discharge.     Extraocular Movements: EOM normal.     Conjunctiva/sclera: Conjunctivae normal.     Pupils: Pupils are equal, round, and reactive to light.  Neck:     Thyroid: No thyromegaly.  Vascular: No carotid bruit.  Cardiovascular:     Rate and Rhythm: Normal rate and regular rhythm.     Pulses: Intact distal pulses.     Heart sounds: Normal heart sounds. No murmur heard.   Pulmonary:     Effort: Pulmonary effort is normal. No respiratory distress.     Breath sounds: Normal breath sounds. No wheezing or rales.  Abdominal:     General: Bowel sounds are normal. There is no distension.     Palpations: Abdomen is soft. There is no mass.     Tenderness: There is no abdominal tenderness.  Musculoskeletal:        General: No tenderness or edema. Normal range of motion.     Cervical back: Normal range of motion and neck supple.     Right lower leg: No edema.     Left lower leg: No edema.     Comments: Upper / Lower Extremities: - Normal muscle tone, strength bilateral upper extremities 5/5, lower extremities 5/5  Lymphadenopathy:     Cervical: No cervical adenopathy.  Skin:    General: Skin is warm and dry.     Findings: No erythema or rash.  Neurological:     Mental Status: He is alert and oriented to person, place, and time.     Comments: Distal sensation intact to light touch all extremities  Psychiatric:        Mood and Affect: Mood and affect normal.        Behavior: Behavior normal.     Comments: Well groomed, good eye contact, normal speech and thoughts        Results for orders placed or performed in visit on 01/18/21  TSH  Result Value Ref Range    TSH 2.17 0.40 - 4.50 mIU/L  Hepatitis C antibody  Result Value Ref Range   Hepatitis C Ab NON-REACTIVE NON-REACTI   SIGNAL TO CUT-OFF 0.01 <1.00  PSA  Result Value Ref Range   PSA 0.54 < OR = 4.0 ng/mL  Lipid panel  Result Value Ref Range   Cholesterol 203 (H) <200 mg/dL   HDL 29 (L) > OR = 40 mg/dL   Triglycerides 375 (H) <150 mg/dL   LDL Cholesterol (Calc) 120 (H) mg/dL (calc)   Total CHOL/HDL Ratio 7.0 (H) <5.0 (calc)   Non-HDL Cholesterol (Calc) 174 (H) <130 mg/dL (calc)  COMPLETE METABOLIC PANEL WITH GFR  Result Value Ref Range   Glucose, Bld 107 (H) 65 - 99 mg/dL   BUN 11 7 - 25 mg/dL   Creat 0.93 0.70 - 1.33 mg/dL   GFR, Est Non African American 95 > OR = 60 mL/min/1.13m2   GFR, Est African American 111 > OR = 60 mL/min/1.18m2   BUN/Creatinine Ratio NOT APPLICABLE 6 - 22 (calc)   Sodium 139 135 - 146 mmol/L   Potassium 3.9 3.5 - 5.3 mmol/L   Chloride 108 98 - 110 mmol/L   CO2 25 20 - 32 mmol/L   Calcium 9.2 8.6 - 10.3 mg/dL   Total Protein 6.3 6.1 - 8.1 g/dL   Albumin 4.2 3.6 - 5.1 g/dL   Globulin 2.1 1.9 - 3.7 g/dL (calc)   AG Ratio 2.0 1.0 - 2.5 (calc)   Total Bilirubin 0.5 0.2 - 1.2 mg/dL   Alkaline phosphatase (APISO) 58 35 - 144 U/L   AST 17 10 - 35 U/L   ALT 30 9 - 46 U/L  CBC with Differential/Platelet  Result Value Ref Range   WBC  8.0 3.8 - 10.8 Thousand/uL   RBC 4.82 4.20 - 5.80 Million/uL   Hemoglobin 16.2 13.2 - 17.1 g/dL   HCT 45.8 38.5 - 50.0 %   MCV 95.0 80.0 - 100.0 fL   MCH 33.6 (H) 27.0 - 33.0 pg   MCHC 35.4 32.0 - 36.0 g/dL   RDW 12.5 11.0 - 15.0 %   Platelets 235 140 - 400 Thousand/uL   MPV 10.1 7.5 - 12.5 fL   Neutro Abs 3,456 1,500 - 7,800 cells/uL   Lymphs Abs 3,560 850 - 3,900 cells/uL   Absolute Monocytes 696 200 - 950 cells/uL   Eosinophils Absolute 232 15 - 500 cells/uL   Basophils Absolute 56 0 - 200 cells/uL   Neutrophils Relative % 43.2 %   Total Lymphocyte 44.5 %   Monocytes Relative 8.7 %   Eosinophils Relative 2.9 %    Basophils Relative 0.7 %  Hemoglobin A1c  Result Value Ref Range   Hgb A1c MFr Bld 5.7 (H) <5.7 % of total Hgb   Mean Plasma Glucose 117 mg/dL   eAG (mmol/L) 6.5 mmol/L      Assessment & Plan:   Problem List Items Addressed This Visit    Psychophysiological insomnia   Obesity (BMI 30.0-34.9)   Major depressive disorder, recurrent, in partial remission (HCC)   Hyperlipidemia   Generalized anxiety disorder   Elevated hemoglobin A1c   Barrett's esophagus   Relevant Orders   Ambulatory referral to Gastroenterology    Other Visit Diagnoses    Annual physical exam    -  Primary   Colon cancer screening       Relevant Orders   Ambulatory referral to Gastroenterology      Updated Health Maintenance information Reviewed recent lab results with patient A1c at PreDiabetic range 5.8, counseling on goal for lower sugar, repeat in 6 months Hypertriglyceridemia - goal to improve with diet. Improved LDL at this time. Encouraged improvement to lifestyle with diet and exercise - Goal of weight loss  Mood / Anxiety Improved clinically, depression in partial remission Has come off of Sertraline and Gabapentin - off meds now. Doing well.  Chronic GERD and Barrett's esophagus, requesting evaluation of upper GI endoscopy (prior studies done out of state in [place] years prior), he uses intermittent PPI, also he is now age 68 and due for colonoscopy initial colon cancer screening   Orders Placed This Encounter  Procedures   Ambulatory referral to Gastroenterology    Referral Priority:   Routine    Referral Type:   Consultation    Referral Reason:   Specialty Services Required    Number of Visits Requested:   1     No orders of the defined types were placed in this encounter.    Follow up plan: Return in about 6 months (around 07/28/2021) for 6 month follow-up PreDM A1c, Mood PHQ.   Future labs 6 month - A1c Lipid 07/29/21  Nobie Putnam, [facility] 01/25/2021, 2:20 PM

## 2021-01-25 NOTE — Patient Instructions (Addendum)
Thank you for coming to the office today.  [facility]) [address] [place], [place] [address] Phone: [phone]  Referral sent for evaluation of GERD and for Upper / Lower GI investigation.   Recent Labs    01/20/21 0913  HGBA1C 5.7*    Improved LDL but still elevated Triglycerides.  Main goal is to limit sugar / reduce sodas - and hopefully it will improve by next visit.  DUE for FASTING BLOOD WORK (no food or drink after midnight before the lab appointment, only water or coffee without cream/sugar on the morning of)  SCHEDULE "Lab Only" visit in the morning at the clinic for lab draw in 6 MONTHS   - Make sure Lab Only appointment is at about 1 week before your next appointment, so that results will be available  For Lab Results, once available within 2-3 days of blood draw, you can can log in to MyChart online to view your results and a brief explanation. Also, we can discuss results at next follow-up visit.   Please schedule a Follow-up Appointment to: Return in about 6 months (around 07/28/2021) for 6 month follow-up PreDM A1c, Mood PHQ.  If you have any other questions or concerns, please feel free to call the office or send a message through [place]. You may also schedule an earlier appointment if necessary.  Additionally, you may be receiving a survey about your experience at our office within a few days to 1 week by e-mail or mail. We value your feedback.  Nobie Putnam, DO [place]

## 2021-01-26 ENCOUNTER — Encounter: Payer: Self-pay | Admitting: *Deleted

## 2021-06-28 ENCOUNTER — Ambulatory Visit: Payer: Medicaid Other | Admitting: Family Medicine

## 2021-06-28 ENCOUNTER — Encounter: Payer: Self-pay | Admitting: Family Medicine

## 2021-06-28 ENCOUNTER — Other Ambulatory Visit: Payer: Self-pay

## 2021-06-28 VITALS — BP 159/85 | HR 69 | Ht 70.0 in | Wt 210.2 lb

## 2021-06-28 DIAGNOSIS — M4722 Other spondylosis with radiculopathy, cervical region: Secondary | ICD-10-CM

## 2021-06-28 DIAGNOSIS — G8929 Other chronic pain: Secondary | ICD-10-CM | POA: Diagnosis not present

## 2021-06-28 DIAGNOSIS — M542 Cervicalgia: Secondary | ICD-10-CM | POA: Diagnosis not present

## 2021-06-28 DIAGNOSIS — F3341 Major depressive disorder, recurrent, in partial remission: Secondary | ICD-10-CM

## 2021-06-28 NOTE — Patient Instructions (Addendum)
Thank you for coming to the office today.  Dr. Zella Richer. [place]  Request a consultation with Dr. Lynford Humphrey now. Or, call us at [phone] any time to speak with an [facility] team member.  --------------------------------  Dr Zada Finders Dr Sherley Bounds Dr Ashok Pall Dr Kathyrn Sheriff  Dr Kary Kos  ---------------------------------  [facility]  ADDRESS [address] [place], [place] [phone]   Please schedule a Follow-up Appointment to: Return if symptoms worsen or fail to improve.  If you have any other questions or concerns, please feel free to call the office or send a message through [place]. You may also schedule an earlier appointment if necessary.  Additionally, you may be receiving a survey about your experience at our office within a few days to 1 week by e-mail or mail. We value your feedback.  Nobie Putnam, DO [place]

## 2021-06-28 NOTE — Progress Notes (Signed)
Subjective:    Patient ID: Phillip Finley, male    DOB: [birth year], 49 y.o.   MRN: [id number]  Phillip Finley is a 50 y.o. 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  He is interested in 2nd/3rd opinion   There is significant limited range of motion with rotation of neck, has impacted him for years. He has associated    Depression screen [facility] 2/9 06/28/2021 01/25/2021 08/24/2020  Decreased Interest '2 1 1  '$ Down, Depressed, Hopeless '1 1 2  '$ PHQ - 2 Score '3 2 3  '$ Altered sleeping '3 3 2  '$ Tired, decreased energy '3 3 3  '$ Change in appetite 0 2 0  Feeling bad or failure about yourself  '2 1 1  '$ Trouble concentrating 0 2 2  Moving slowly or fidgety/restless 1 1 0  Suicidal thoughts 1 0 1  PHQ-9 Score '13 14 12  '$ Difficult doing work/chores Somewhat difficult Somewhat difficult Somewhat difficult  Some recent data might be hidden    Social History   Tobacco Use   Smoking status: Every Day    Packs/day: 1.00    Years: 30.00    Pack years: 30.00    Types: Cigarettes   Smokeless tobacco: Never  Substance Use Topics   Alcohol use: Yes    Comment: seldom   Drug use: No    Review of Systems Per HPI unless specifically indicated above     Objective:    BP (!) 159/85   Pulse 69   Ht '5\' 10"'$  (1.778 m)   Wt 210 lb 3.2 oz (95.3 kg)   SpO2 99%   BMI 30.16 kg/m   Wt Readings from Last 3 Encounters:  06/28/21 210 lb 3.2 oz (95.3 kg)  01/25/21 223 lb 12.8 oz (101.5 kg)  08/24/20 223 lb 9.6 oz (101.4 kg)    Physical Exam Vitals and nursing note reviewed.  Constitutional:      General: He is not in acute distress.    Appearance: Normal appearance. He is well-developed. He is not diaphoretic.     Comments: Well-appearing,  comfortable, cooperative  HENT:     Head: Normocephalic and atraumatic.  Eyes:     General:        Right eye: No discharge.        Left eye: No discharge.     Conjunctiva/sclera: Conjunctivae normal.  Neck:     Comments: Reduced range of motion rotation to R is limtied Cardiovascular:     Rate and Rhythm: Normal rate.  Pulmonary:     Effort: Pulmonary effort is normal.  Musculoskeletal:     Cervical back: No rigidity.  Lymphadenopathy:     Cervical: No cervical adenopathy.  Skin:    General: Skin is warm and dry.     Findings: No erythema or rash.  Neurological:     Mental Status: He is alert and oriented to person, place, and time.  Psychiatric:        Mood and Affect: Mood normal.        Behavior: Behavior normal.        Thought Content: Thought content normal.     Comments: Well groomed, good eye contact, normal speech and thoughts     Results for  orders placed or performed in visit on 01/18/21  TSH  Result Value Ref Range   TSH 2.17 0.40 - 4.50 mIU/L  Hepatitis C antibody  Result Value Ref Range   Hepatitis C Ab NON-REACTIVE NON-REACTI   SIGNAL TO CUT-OFF 0.01 <1.00  PSA  Result Value Ref Range   PSA 0.54 < OR = 4.0 ng/mL  Lipid panel  Result Value Ref Range   Cholesterol 203 (H) <200 mg/dL   HDL 29 (L) > OR = 40 mg/dL   Triglycerides 375 (H) <150 mg/dL   LDL Cholesterol (Calc) 120 (H) mg/dL (calc)   Total CHOL/HDL Ratio 7.0 (H) <5.0 (calc)   Non-HDL Cholesterol (Calc) 174 (H) <130 mg/dL (calc)  COMPLETE METABOLIC PANEL WITH GFR  Result Value Ref Range   Glucose, Bld 107 (H) 65 - 99 mg/dL   BUN 11 7 - 25 mg/dL   Creat 0.93 0.70 - 1.33 mg/dL   GFR, Est Non African American 95 > OR = 60 mL/min/1.45m   GFR, Est African American 111 > OR = 60 mL/min/1.759m  BUN/Creatinine Ratio NOT APPLICABLE 6 - 22 (calc)   Sodium 139 135 - 146 mmol/L   Potassium 3.9 3.5 - 5.3 mmol/L   Chloride 108 98 - 110 mmol/L   CO2 25 20 - 32 mmol/L   Calcium 9.2 8.6 - 10.3 mg/dL    Total Protein 6.3 6.1 - 8.1 g/dL   Albumin 4.2 3.6 - 5.1 g/dL   Globulin 2.1 1.9 - 3.7 g/dL (calc)   AG Ratio 2.0 1.0 - 2.5 (calc)   Total Bilirubin 0.5 0.2 - 1.2 mg/dL   Alkaline phosphatase (APISO) 58 35 - 144 U/L   AST 17 10 - 35 U/L   ALT 30 9 - 46 U/L  CBC with Differential/Platelet  Result Value Ref Range   WBC 8.0 3.8 - 10.8 Thousand/uL   RBC 4.82 4.20 - 5.80 Million/uL   Hemoglobin 16.2 13.2 - 17.1 g/dL   HCT 45.8 38.5 - 50.0 %   MCV 95.0 80.0 - 100.0 fL   MCH 33.6 (H) 27.0 - 33.0 pg   MCHC 35.4 32.0 - 36.0 g/dL   RDW 12.5 11.0 - 15.0 %   Platelets 235 140 - 400 Thousand/uL   MPV 10.1 7.5 - 12.5 fL   Neutro Abs 3,456 1,500 - 7,800 cells/uL   Lymphs Abs 3,560 850 - 3,900 cells/uL   Absolute Monocytes 696 200 - 950 cells/uL   Eosinophils Absolute 232 15 - 500 cells/uL   Basophils Absolute 56 0 - 200 cells/uL   Neutrophils Relative % 43.2 %   Total Lymphocyte 44.5 %   Monocytes Relative 8.7 %   Eosinophils Relative 2.9 %   Basophils Relative 0.7 %  Hemoglobin A1c  Result Value Ref Range   Hgb A1c MFr Bld 5.7 (H) <5.7 % of total Hgb   Mean Plasma Glucose 117 mg/dL   eAG (mmol/L) 6.5 mmol/L      Assessment & Plan:   Problem List Items Addressed This Visit     Osteoarthritis of spine with radiculopathy, cervical region - Primary   Relevant Medications   gabapentin (NEURONTIN) 300 MG capsule   meloxicam (MOBIC) 15 MG tablet   Major depressive disorder, recurrent, in partial remission (HCC)   Other Visit Diagnoses     Chronic neck pain       Relevant Medications   gabapentin (NEURONTIN) 300 MG capsule   meloxicam (MOBIC) 15 MG tablet  Cervical OA DDD Radiculopathy, nerve impingement  Followed by [facility] Followed by Dr Angola, has seen Dr Velna Ochs as well.  S/p multiple ESI and Nerve ablations with mixed results.  They offered surgical options next, he remains unsure.  He will reach out to Dr Charlotte Sanes [facility] next for  consultation 2nd opinion, if needs new referral can contact me or we can consider other offices such as [facility] in [place].   No orders of the defined types were placed in this encounter.    Follow up plan: Return if symptoms worsen or fail to improve.   Nobie Putnam, [facility] 06/28/2021, 10:58 AM

## 2021-07-29 ENCOUNTER — Other Ambulatory Visit: Payer: Medicaid Other

## 2021-07-29 ENCOUNTER — Other Ambulatory Visit: Payer: Self-pay

## 2021-07-29 DIAGNOSIS — E782 Mixed hyperlipidemia: Secondary | ICD-10-CM

## 2021-07-29 DIAGNOSIS — R7309 Other abnormal glucose: Secondary | ICD-10-CM | POA: Diagnosis not present

## 2021-07-30 LAB — LIPID PANEL
Cholesterol: 203 mg/dL — ABNORMAL HIGH (ref <200)
HDL: 36 mg/dL — ABNORMAL LOW (ref >=40)
LDL Cholesterol (Calc): 132 mg/dL (calc) — ABNORMAL HIGH
Non-HDL Cholesterol (Calc): 167 mg/dL (calc) — ABNORMAL HIGH (ref <130)
Total CHOL/HDL Ratio: 5.6 (calc) — ABNORMAL HIGH (ref <5.0)
Triglycerides: 201 mg/dL — ABNORMAL HIGH (ref <150)

## 2021-07-30 LAB — HEMOGLOBIN A1C
Hgb A1c MFr Bld: 5.5 % of total Hgb (ref <5.7)
Mean Plasma Glucose: 111 mg/dL
eAG (mmol/L): 6.2 mmol/L

## 2021-08-04 ENCOUNTER — Other Ambulatory Visit: Payer: Self-pay

## 2021-08-04 ENCOUNTER — Ambulatory Visit: Payer: Medicaid Other | Admitting: Family Medicine

## 2021-08-04 ENCOUNTER — Other Ambulatory Visit: Payer: Self-pay | Admitting: Family Medicine

## 2021-08-04 ENCOUNTER — Encounter: Payer: Self-pay | Admitting: Family Medicine

## 2021-08-04 VITALS — BP 117/67 | HR 66 | Ht 70.0 in | Wt 206.2 lb

## 2021-08-04 DIAGNOSIS — Z125 Encounter for screening for malignant neoplasm of prostate: Secondary | ICD-10-CM

## 2021-08-04 DIAGNOSIS — K2270 Barrett's esophagus without dysplasia: Secondary | ICD-10-CM | POA: Diagnosis not present

## 2021-08-04 DIAGNOSIS — E782 Mixed hyperlipidemia: Secondary | ICD-10-CM

## 2021-08-04 DIAGNOSIS — F5104 Psychophysiologic insomnia: Secondary | ICD-10-CM

## 2021-08-04 DIAGNOSIS — R7309 Other abnormal glucose: Secondary | ICD-10-CM

## 2021-08-04 DIAGNOSIS — E663 Overweight: Secondary | ICD-10-CM | POA: Diagnosis not present

## 2021-08-04 DIAGNOSIS — F3341 Major depressive disorder, recurrent, in partial remission: Secondary | ICD-10-CM | POA: Diagnosis not present

## 2021-08-04 DIAGNOSIS — R351 Nocturia: Secondary | ICD-10-CM

## 2021-08-04 DIAGNOSIS — Z0000 Encounter for general adult medical examination without abnormal findings: Secondary | ICD-10-CM

## 2021-08-04 MED ORDER — ESOMEPRAZOLE MAGNESIUM 40 MG PO CPDR: 40.0000 mg | DELAYED_RELEASE_CAPSULE | Freq: Every day | ORAL | 3 refills | Status: DC

## 2021-08-04 NOTE — Progress Notes (Signed)
Subjective:    Patient ID: Phillip Finley, male    DOB: 1971-04-01, 50 y.o.   MRN: [id number]  Phillip Finley is a 50 y.o. male presenting on 08/04/2021 for Diabetes and Depression   HPI  Elevated A1c A1c improved to 5.5, prior 5.7 to 5.8 range Meds: None Lifestyle:  - Diet (improved) Weight down 20 lbs since last visit He has cut back sodas and reduced carbs / starchy foods calories overall. - Exercise (Limited currently) Denies hypoglycemia, polyuria, visual changes, numbness or tingling.   HYPERLIPIDEMIA: - Reports no concerns. Last lipid panel 07/2021, TG down from 375 to 200, LDL still elevated 130 Not on statin therapy.    Chronic Neck pain / OA DJD He has chronic problem. Reduced ROM Previously - on Gabapentin '300mg'$  x 2 = '600mg'$  nightly, needs refill He said ortho, want to do surgery he is hesitant  he is unsure about 2nd opinion. Doesn't want to do surgery.   Chronic Depression recurrent - remission Anxiety / Insomnia: He has weaned down off of Sertraline '100mg'$  daily over past >1-2 months. Now off medication and doing well. He also took Gabapentin '300mg'$  x 2 = '600mg'$  nightly for nec issue and also with insomnia.   Additional history   GERD / reflux Needs referral to GI for Upper GI - taking Esomeprazole '20mg'$  most days, but not regularly. He attempted to take '40mg'$  dose in past but insurance declined it.   Diastasis Recti no symptoms    Depression screen [facility] 2/9 08/04/2021 06/28/2021 01/25/2021  Decreased Interest '1 2 1  '$ Down, Depressed, Hopeless '1 1 1  '$ PHQ - 2 Score '2 3 2  '$ Altered sleeping '2 3 3  '$ Tired, decreased energy '2 3 3  '$ Change in appetite 0 0 2  Feeling bad or failure about yourself  '1 2 1  '$ Trouble concentrating 1 0 2  Moving slowly or fidgety/restless '1 1 1  '$ Suicidal thoughts 0 1 0  PHQ-9 Score '9 13 14  '$ Difficult doing work/chores Very difficult Somewhat difficult Somewhat difficult  Some recent data might be hidden    Social History    Tobacco Use   Smoking status: Every Day    Packs/day: 1.00    Years: 30.00    Pack years: 30.00    Types: Cigarettes   Smokeless tobacco: Never  Substance Use Topics   Alcohol use: Yes    Comment: seldom   Drug use: No    Review of Systems Per HPI unless specifically indicated above     Objective:    BP 117/67   Pulse 66   Ht '5\' 10"'$  (1.778 m)   Wt 206 lb 3.2 oz (93.5 kg)   SpO2 99%   BMI 29.59 kg/m   Wt Readings from Last 3 Encounters:  08/04/21 206 lb 3.2 oz (93.5 kg)  06/28/21 210 lb 3.2 oz (95.3 kg)  01/25/21 223 lb 12.8 oz (101.5 kg)    Physical Exam Vitals and nursing note reviewed.  Constitutional:      General: He is not in acute distress.    Appearance: Normal appearance. He is well-developed. He is not diaphoretic.     Comments: Well-appearing, comfortable, cooperative  HENT:     Head: Normocephalic and atraumatic.  Eyes:     General:        Right eye: No discharge.        Left eye: No discharge.     Conjunctiva/sclera: Conjunctivae normal.  Cardiovascular:  Rate and Rhythm: Normal rate.  Pulmonary:     Effort: Pulmonary effort is normal.  Skin:    General: Skin is warm and dry.     Findings: No erythema or rash.  Neurological:     Mental Status: He is alert and oriented to person, place, and time.  Psychiatric:        Mood and Affect: Mood normal.        Behavior: Behavior normal.        Thought Content: Thought content normal.     Comments: Well groomed, good eye contact, normal speech and thoughts     Results for orders placed or performed in visit on 07/29/21  Lipid panel  Result Value Ref Range   Cholesterol 203 (H) <200 mg/dL   HDL 36 (L) > OR = 40 mg/dL   Triglycerides 201 (H) <150 mg/dL   LDL Cholesterol (Calc) 132 (H) mg/dL (calc)   Total CHOL/HDL Ratio 5.6 (H) <5.0 (calc)   Non-HDL Cholesterol (Calc) 167 (H) <130 mg/dL (calc)  Hemoglobin A1c  Result Value Ref Range   Hgb A1c MFr Bld 5.5 <5.7 % of total Hgb   Mean  Plasma Glucose 111 mg/dL   eAG (mmol/L) 6.2 mmol/L      Assessment & Plan:   Problem List Items Addressed This Visit     Overweight (BMI 25.0-29.9)   Major depressive disorder, recurrent, in partial remission (HCC)   Hyperlipidemia   Elevated hemoglobin A1c   Barrett's esophagus - Primary   Relevant Medications   esomeprazole (NEXIUM) 40 MG capsule    GERD  History Barrett's Esophagus Chronic problem. Previously managed and discussed. Now has some persistent refractory symptoms. Last seen for this issue 01/2021, we referred him to GI however apt was never scheduled. He was on OTC Nexium '20mg'$  as ins would not pay for '40mg'$  Will attempt re order '40mg'$  daily before meal If not he can take '20mg'$  and double up He should contact [facility] today or soon to re-schedule the referral new patient apt, and if they need new referral he can let me know ASAP and I can place referral.  Lab review  HLD Reviewed, goal with lifestyle modification - he is improving.  A1c Improved, continue lifestyle diet improvement.  Weight loss  Major Depression recurrent partial remission Stable improved at this time.  Meds ordered this encounter  Medications   esomeprazole (NEXIUM) 40 MG capsule    Sig: Take 1 capsule (40 mg total) by mouth daily before breakfast.    Dispense:  30 capsule    Refill:  3     Follow up plan: Return in about 6 months (around 02/01/2022) for 6 month fasting lab only then 1 week later Annual Physical.  Future labs ordered for 01/2022  Nobie Putnam, [facility] 08/04/2021, 2:31 PM

## 2021-08-04 NOTE — Patient Instructions (Addendum)
Thank you for coming to the office today.  We referred you back in March 2022 - they attempted to contact you and send letter on mychart.  Please call them at GI office first and let me know if they need a new referral.  [facility]) [address], [place] [address] Phone: [phone]  Abdominal wall muscle issue is called Diastasis Recti - with gap in abdominal wall muscles with weakness in connective tissue.  Recent Labs    01/20/21 0913 07/29/21 0822  HGBA1C 5.7* 5.5   Good results on Cholesterol.  Encouraged to keep up the good work with lifestyle diet exercise.  DUE for FASTING BLOOD WORK (no food or drink after midnight before the lab appointment, only water or coffee without cream/sugar on the morning of)  SCHEDULE "Lab Only" visit in the morning at the clinic for lab draw in 6 MONTHS   - Make sure Lab Only appointment is at about 1 week before your next appointment, so that results will be available  For Lab Results, once available within 2-3 days of blood draw, you can can log in to MyChart online to view your results and a brief explanation. Also, we can discuss results at next follow-up visit.   Please schedule a Follow-up Appointment to: Return in about 6 months (around 02/01/2022) for 6 month fasting lab only then 1 week later Annual Physical.  If you have any other questions or concerns, please feel free to call the office or send a message through [place]. You may also schedule an earlier appointment if necessary.  Additionally, you may be receiving a survey about your experience at our office within a few days to 1 week by e-mail or mail. We value your feedback.  Nobie Putnam, DO [place] [facility]

## 2021-08-08 ENCOUNTER — Other Ambulatory Visit: Payer: Self-pay

## 2021-08-08 DIAGNOSIS — R079 Chest pain, unspecified: Secondary | ICD-10-CM | POA: Diagnosis not present

## 2021-08-08 DIAGNOSIS — Z5321 Procedure and treatment not carried out due to patient leaving prior to being seen by health care provider: Secondary | ICD-10-CM | POA: Insufficient documentation

## 2021-08-08 DIAGNOSIS — R0602 Shortness of breath: Secondary | ICD-10-CM | POA: Insufficient documentation

## 2021-08-08 DIAGNOSIS — R110 Nausea: Secondary | ICD-10-CM | POA: Insufficient documentation

## 2021-08-08 LAB — CBC
HCT: 41.2 % (ref 39.0–52.0)
Hemoglobin: 15.3 g/dL (ref 13.0–17.0)
MCH: 34.5 pg — ABNORMAL HIGH (ref 26.0–34.0)
MCHC: 37.1 g/dL — ABNORMAL HIGH (ref 30.0–36.0)
MCV: 93 fL (ref 80.0–100.0)
Platelets: 184 10*3/uL (ref 150–400)
RBC: 4.43 MIL/uL (ref 4.22–5.81)
RDW: 12.4 % (ref 11.5–15.5)
WBC: 6.8 10*3/uL (ref 4.0–10.5)
nRBC: 0 % (ref 0.0–0.2)

## 2021-08-08 LAB — COMPREHENSIVE METABOLIC PANEL
ALT: 20 U/L (ref 0–44)
AST: 17 U/L (ref 15–41)
Albumin: 4.5 g/dL (ref 3.5–5.0)
Alkaline Phosphatase: 57 U/L (ref 38–126)
Anion gap: 7 (ref 5–15)
BUN: 11 mg/dL (ref 6–20)
CO2: 28 mmol/L (ref 22–32)
Calcium: 9.2 mg/dL (ref 8.9–10.3)
Chloride: 102 mmol/L (ref 98–111)
Creatinine, Ser: 1.18 mg/dL (ref 0.61–1.24)
GFR, Estimated: >60 mL/min (ref >60)
Glucose, Bld: 119 mg/dL — ABNORMAL HIGH (ref 70–99)
Potassium: 4.1 mmol/L (ref 3.5–5.1)
Sodium: 137 mmol/L (ref 135–145)
Total Bilirubin: 0.8 mg/dL (ref 0.3–1.2)
Total Protein: 7.2 g/dL (ref 6.5–8.1)

## 2021-08-08 LAB — TROPONIN I (HIGH SENSITIVITY): Troponin I (High Sensitivity): 4 ng/L (ref <18)

## 2021-08-08 LAB — LIPASE, BLOOD: Lipase: 36 U/L (ref 11–51)

## 2021-08-08 NOTE — ED Triage Notes (Signed)
Pt states chest pain with feeling like he is going to pass out this pm. Pt states has had nausea, some shob. Pt states he feels weak. No diaphoresis noted.

## 2021-08-09 ENCOUNTER — Encounter: Payer: Self-pay | Admitting: Family Medicine

## 2021-08-09 ENCOUNTER — Emergency Department
Admission: EM | Admit: 2021-08-09 | Discharge: 2021-08-09 | Disposition: A | Discharge status: Home / Self Care | Payer: Medicaid Other | Attending: Student in an Organized Health Care Education/Training Program | Admitting: Student in an Organized Health Care Education/Training Program

## 2021-08-09 ENCOUNTER — Emergency Department: Payer: Medicaid Other

## 2021-08-09 ENCOUNTER — Other Ambulatory Visit (INDEPENDENT_AMBULATORY_CARE_PROVIDER_SITE_OTHER): Payer: Self-pay

## 2021-08-09 ENCOUNTER — Telehealth: Payer: Self-pay

## 2021-08-09 DIAGNOSIS — K2270 Barrett's esophagus without dysplasia: Secondary | ICD-10-CM

## 2021-08-09 DIAGNOSIS — Z1211 Encounter for screening for malignant neoplasm of colon: Secondary | ICD-10-CM

## 2021-08-09 MED ORDER — PEG 3350-KCL-NA BICARB-NACL 420 G PO SOLR: 4000.0000 mL | Freq: Once | ORAL | 0 refills | Status: AC

## 2021-08-09 NOTE — Progress Notes (Signed)
Gastroenterology Pre-Procedure Review  Request Date: 08/25/2021 Requesting Physician: Dr. Quincy Simmonds  PATIENT REVIEW QUESTIONS: The patient responded to the following health history questions as indicated:    1. Are you having any GI issues? yes (chest pain reflux) 2. Do you have a personal history of Polyps? no 3. Do you have a family history of Colon Cancer or Polyps? no 4. Diabetes Mellitus? no 5. Joint replacements in the past 12 months?no 6. Major health problems in the past 3 months?yes (gutt issues) 7. Any artificial heart valves, MVP, or defibrillator?no    MEDICATIONS & ALLERGIES:    Patient reports the following regarding taking any anticoagulation/antiplatelet therapy:   Plavix, Coumadin, Eliquis, Xarelto, Lovenox, Pradaxa, Brilinta, or Effient? no Aspirin? no  Patient confirms/reports the following medications:  Current Outpatient Medications  Medication Sig Dispense Refill   esomeprazole (NEXIUM) 40 MG capsule Take 1 capsule (40 mg total) by mouth daily before breakfast. 30 capsule 3   gabapentin (NEURONTIN) 300 MG capsule gabapentin 300 mg capsule  1 capsule three times day     meloxicam (MOBIC) 15 MG tablet meloxicam 15 mg tablet  Take 1 tablet every day by oral route with meals.  daily with food     No current facility-administered medications for this visit.    Patient confirms/reports the following allergies:  No Known Allergies  No orders of the defined types were placed in this encounter.   AUTHORIZATION INFORMATION Primary Insurance: 1D#: Group #:  Secondary Insurance: 1D#: Group #:  SCHEDULE INFORMATION: Date: 08/25/2021 Time: Location: [facility]

## 2021-08-09 NOTE — ED Notes (Signed)
Pt called by X-ray x 2, no answer, not visualized in the lobby.

## 2021-08-09 NOTE — Telephone Encounter (Signed)
Pt. Ready to schedule procedure.. 

## 2021-08-10 ENCOUNTER — Telehealth: Payer: Self-pay

## 2021-08-10 NOTE — Telephone Encounter (Signed)
Transition Care Management Unsuccessful Follow-up Telephone Call  Date of discharge and from where:  08/09/2021-[facility]  Attempts:  1st Attempt  Reason for unsuccessful TCM follow-up call:  Left voice message

## 2021-08-12 NOTE — Telephone Encounter (Signed)
Transition Care Management Follow-up Telephone Call Date of discharge and from where: 08/09/2021 from [facility] How have you been since you were released from the hospital? Pt stated that he is still having symptoms and stated that he would rather die at home than in the waiting room. I did let patient know that there are other ED's that are stand alone and move a bit faster.  Any questions or concerns? No  Items Reviewed: Did the pt receive and understand the discharge instructions provided? Yes  Medications obtained and verified? Yes  Other? No  Any new allergies since your discharge? No  Dietary orders reviewed? No Do you have support at home? Yes   Functional Questionnaire: (I = Independent and D = Dependent) ADLs: I  Bathing/Dressing- I  Meal Prep- I  Eating- I  Maintaining continence- I  Transferring/Ambulation- I  Managing Meds- I  Follow up appointments reviewed:  PCP Hospital f/u appt confirmed? No   Specialist Hospital f/u appt confirmed? No   Are transportation arrangements needed? No  If their condition worsens, is the pt aware to call PCP or go to the Emergency Dept.? Yes Was the patient provided with contact information for the PCP's office or ED? Yes Was to pt encouraged to call back with questions or concerns? Yes

## 2021-08-25 ENCOUNTER — Ambulatory Visit
Admission: RE | Admit: 2021-08-25 | Discharge: 2021-08-25 | Disposition: A | Discharge status: Home / Self Care | Payer: Medicaid Other | Attending: Gastroenterology | Admitting: Gastroenterology

## 2021-08-25 ENCOUNTER — Ambulatory Visit: Payer: Medicaid Other | Admitting: Anesthesiology

## 2021-08-25 ENCOUNTER — Encounter: Payer: Self-pay | Admitting: Gastroenterology

## 2021-08-25 ENCOUNTER — Encounter
Admission: RE | Disposition: A | Discharge status: Home / Self Care | Payer: Self-pay | Source: Home / Self Care | Attending: Gastroenterology

## 2021-08-25 DIAGNOSIS — D124 Benign neoplasm of descending colon: Secondary | ICD-10-CM | POA: Diagnosis not present

## 2021-08-25 DIAGNOSIS — Z8349 Family history of other endocrine, nutritional and metabolic diseases: Secondary | ICD-10-CM | POA: Insufficient documentation

## 2021-08-25 DIAGNOSIS — K635 Polyp of colon: Secondary | ICD-10-CM

## 2021-08-25 DIAGNOSIS — D122 Benign neoplasm of ascending colon: Secondary | ICD-10-CM | POA: Insufficient documentation

## 2021-08-25 DIAGNOSIS — Z791 Long term (current) use of non-steroidal anti-inflammatories (NSAID): Secondary | ICD-10-CM | POA: Diagnosis not present

## 2021-08-25 DIAGNOSIS — K648 Other hemorrhoids: Secondary | ICD-10-CM | POA: Insufficient documentation

## 2021-08-25 DIAGNOSIS — K449 Diaphragmatic hernia without obstruction or gangrene: Secondary | ICD-10-CM

## 2021-08-25 DIAGNOSIS — D125 Benign neoplasm of sigmoid colon: Secondary | ICD-10-CM | POA: Diagnosis not present

## 2021-08-25 DIAGNOSIS — K219 Gastro-esophageal reflux disease without esophagitis: Secondary | ICD-10-CM | POA: Diagnosis not present

## 2021-08-25 DIAGNOSIS — Z79899 Other long term (current) drug therapy: Secondary | ICD-10-CM | POA: Insufficient documentation

## 2021-08-25 DIAGNOSIS — K317 Polyp of stomach and duodenum: Secondary | ICD-10-CM

## 2021-08-25 DIAGNOSIS — Z8249 Family history of ischemic heart disease and other diseases of the circulatory system: Secondary | ICD-10-CM | POA: Insufficient documentation

## 2021-08-25 DIAGNOSIS — Z1211 Encounter for screening for malignant neoplasm of colon: Secondary | ICD-10-CM | POA: Diagnosis not present

## 2021-08-25 DIAGNOSIS — K649 Unspecified hemorrhoids: Secondary | ICD-10-CM | POA: Diagnosis not present

## 2021-08-25 DIAGNOSIS — K3189 Other diseases of stomach and duodenum: Secondary | ICD-10-CM | POA: Diagnosis not present

## 2021-08-25 DIAGNOSIS — F17210 Nicotine dependence, cigarettes, uncomplicated: Secondary | ICD-10-CM | POA: Insufficient documentation

## 2021-08-25 DIAGNOSIS — K2270 Barrett's esophagus without dysplasia: Secondary | ICD-10-CM | POA: Diagnosis not present

## 2021-08-25 DIAGNOSIS — K2980 Duodenitis without bleeding: Secondary | ICD-10-CM | POA: Insufficient documentation

## 2021-08-25 DIAGNOSIS — D131 Benign neoplasm of stomach: Secondary | ICD-10-CM | POA: Diagnosis not present

## 2021-08-25 HISTORY — PX: COLONOSCOPY WITH PROPOFOL: SHX5780

## 2021-08-25 HISTORY — PX: ESOPHAGOGASTRODUODENOSCOPY: SHX5428

## 2021-08-25 SURGERY — COLONOSCOPY WITH PROPOFOL
Anesthesia: General

## 2021-08-25 MED ORDER — LIDOCAINE HCL (CARDIAC) PF 100 MG/5ML IV SOSY
PREFILLED_SYRINGE | INTRAVENOUS | Status: DC | PRN
  Administered 2021-08-25: 50 mg via INTRAVENOUS

## 2021-08-25 MED ORDER — PHENYLEPHRINE HCL (PRESSORS) 10 MG/ML IV SOLN
INTRAVENOUS | Status: DC | PRN
  Administered 2021-08-25 (×4): 100 ug via INTRAVENOUS

## 2021-08-25 MED ORDER — PROPOFOL 500 MG/50ML IV EMUL
INTRAVENOUS | Status: AC
  Filled 2021-08-25: qty 50

## 2021-08-25 MED ORDER — SODIUM CHLORIDE 0.9 % IV SOLN
INTRAVENOUS | Status: DC
  Administered 2021-08-25: 1000 mL via INTRAVENOUS

## 2021-08-25 MED ORDER — PROPOFOL 500 MG/50ML IV EMUL
INTRAVENOUS | Status: DC | PRN
  Administered 2021-08-25: 175 ug/kg/min via INTRAVENOUS

## 2021-08-25 MED ORDER — DEXMEDETOMIDINE (PRECEDEX) IN NS 20 MCG/5ML (4 MCG/ML) IV SYRINGE
PREFILLED_SYRINGE | INTRAVENOUS | Status: DC | PRN
  Administered 2021-08-25: 12 ug via INTRAVENOUS

## 2021-08-25 MED ORDER — LIDOCAINE HCL (PF) 2 % IJ SOLN
INTRAMUSCULAR | Status: AC
  Filled 2021-08-25: qty 5

## 2021-08-25 MED ORDER — PROPOFOL 10 MG/ML IV BOLUS
INTRAVENOUS | Status: DC | PRN
  Administered 2021-08-25: 70 mg via INTRAVENOUS

## 2021-08-25 NOTE — Op Note (Signed)
[facility]astroenterology Patient Name: Phillip Finley Procedure Date: 08/25/2021 10:18 AM MRN: [id number] Account #: [id number] Date of Birth: Sep 20, 1971 Admit Type: Outpatient Age: 50 Room: [facility] ENDO ROOM 3 Gender: Male Note Status: Finalized Instrument Name: Jasper Riling [id number] Procedure:             Colonoscopy Indications:           Screening for colorectal malignant neoplasm Providers:             Teffany Blaszczyk B. Bonna Gains MD, MD Referring MD:          Olin Hauser (Referring MD) Medicines:             Monitored Anesthesia Care Complications:         No immediate complications. Procedure:             Pre-Anesthesia Assessment:                        - ASA Grade Assessment: II - A patient with mild                         systemic disease.                        - Prior to the procedure, a History and Physical was                         performed, and patient medications, allergies and                         sensitivities were reviewed. The patient's tolerance                         of previous anesthesia was reviewed.                        - The risks and benefits of the procedure and the                         sedation options and risks were discussed with the                         patient. All questions were answered and informed                         consent was obtained.                        - Patient identification and proposed procedure were                         verified prior to the procedure by the physician, the                         nurse, the anesthesiologist, the anesthetist and the                         technician. The procedure was verified in the  procedure room.                        After obtaining informed consent, the colonoscope was                         passed under direct vision. Throughout the procedure,                         the patient's blood pressure, pulse, and oxygen                          saturations were monitored continuously. The                         Colonoscope was introduced through the anus and                         advanced to the the cecum, identified by appendiceal                         orifice and ileocecal valve. The colonoscopy was                         performed with ease. The patient tolerated the                         procedure well. The quality of the bowel preparation                         was fair. Findings:      The perianal and digital rectal examinations were normal.      A 6 mm polyp was found in the ascending colon. The polyp was sessile.       The polyp was removed with a cold snare. Resection and retrieval were       complete.      Two flat polyps were found in the ascending colon. The polyps were 10 to       12 mm in size. These polyps were removed with a piecemeal technique       using a cold snare. Resection and retrieval were complete.      Two sessile polyps were found in the descending colon. The polyps were 6       to 7 mm in size. These polyps were removed with a cold snare. Resection       and retrieval were complete.      A 5 mm polyp was found in the sigmoid colon. The polyp was sessile. The       polyp was removed with a cold snare. Resection and retrieval were       complete.      The exam was otherwise without abnormality.      The rectum, sigmoid colon, descending colon, transverse colon, ascending       colon and cecum appeared normal.      Non-bleeding internal hemorrhoids were found during retroflexion. Impression:            - Preparation of the colon was fair.                        -  One 6 mm polyp in the ascending colon, removed with                         a cold snare. Resected and retrieved.                        - Two 10 to 12 mm polyps in the ascending colon,                         removed piecemeal using a cold snare. Resected and                         retrieved.                         - Two 6 to 7 mm polyps in the descending colon,                         removed with a cold snare. Resected and retrieved.                        - One 5 mm polyp in the sigmoid colon, removed with a                         cold snare. Resected and retrieved.                        - The examination was otherwise normal.                        - The rectum, sigmoid colon, descending colon,                         transverse colon, ascending colon and cecum are normal.                        - Non-bleeding internal hemorrhoids. Recommendation:        - Discharge patient to home (with escort).                        - Advance diet as tolerated.                        - Continue present medications.                        - Await pathology results.                        - Repeat colonoscopy date to be determined after                         pending pathology results are reviewed.                        - The findings and recommendations were discussed with                         the patient.                        -  The findings and recommendations were discussed with                         the patient's family.                        - Return to primary care physician as previously                         scheduled.                        - High fiber diet. Procedure Code(s):     --- Professional ---                        [phone], Colonoscopy, flexible; with removal of                         tumor(s), polyp(s), or other lesion(s) by snare                         technique Diagnosis Code(s):     --- Professional ---                        K63.5, Polyp of colon                        Z12.11, Encounter for screening for malignant neoplasm                         of colon CPT copyright 2019 American Medical [employer]. All rights reserved. The codes documented in this report are preliminary and upon coder review may  be revised to meet current compliance requirements.  [name], MD Margretta Sidle B. Bonna Gains MD, MD 08/25/2021 11:31:49 AM This report has been signed electronically. Number of Addenda: 0 Note Initiated On: 08/25/2021 10:18 AM Scope Withdrawal Time: 0 hours 25 minutes 7 seconds  Total Procedure Duration: 0 hours 28 minutes 54 seconds  Estimated Blood Loss:  Estimated blood loss: none.      [facility]

## 2021-08-25 NOTE — Anesthesia Procedure Notes (Signed)
Date/Time: 08/25/2021 10:28 AM Performed by: Johnna Acosta, CRNA Pre-anesthesia Checklist: Patient identified, Emergency Drugs available, Suction available, Patient being monitored and Timeout performed Patient Re-evaluated:Patient Re-evaluated prior to induction Oxygen Delivery Method: Nasal cannula Preoxygenation: Pre-oxygenation with 100% oxygen Induction Type: IV induction

## 2021-08-25 NOTE — Op Note (Signed)
[facility] Gastroenterology Patient Name: Phillip Finley Procedure Date: 08/25/2021 10:19 AM MRN: [id number] Account #: [id number] Date of Birth: Mar 09, 1971 Admit Type: Outpatient Age: 50 Room: [facility] ENDO ROOM 3 Gender: Male Note Status: Finalized Instrument Name: Upper Endoscope [id number] Procedure:             Upper GI endoscopy Indications:           Heartburn, Follow-up of Barrett's esophagus Providers:             Chalsey Leeth B. Bonna Gains MD, MD Referring MD:          Olin Hauser (Referring MD) Medicines:             Monitored Anesthesia Care Complications:         No immediate complications. Procedure:             Pre-Anesthesia Assessment:                        - The risks and benefits of the procedure and the                         sedation options and risks were discussed with the                         patient. All questions were answered and informed                         consent was obtained.                        - Patient identification and proposed procedure were                         verified prior to the procedure.                        - ASA Grade Assessment: II - A patient with mild                         systemic disease.                        After obtaining informed consent, the endoscope was                         passed under direct vision. Throughout the procedure,                         the patient's blood pressure, pulse, and oxygen                         saturations were monitored continuously. The Endoscope                         was introduced through the mouth, and advanced to the                         second part of duodenum. The upper GI endoscopy was  accomplished with ease. The patient tolerated the                         procedure well. Findings:      Scattered islands of salmon-colored mucosa were present. No other       visible abnormalities were present. The maximum longitudinal  extent of       these esophageal mucosal changes was 2 cm in length. Mucosa was biopsied       with a cold forceps for histology.      The exam of the esophagus was otherwise normal.      A single 5 mm sessile polyp with no stigmata of recent bleeding was       found in the gastric body. Biopsies were taken with a cold forceps for       histology.      A small hiatal hernia was present.      The exam of the stomach was otherwise normal.      Localized mild mucosal changes characterized by thickened folds were       found in the duodenal bulb. Biopsies were taken with a cold forceps for       histology.      The exam of the duodenum was otherwise normal. Impression:            - Salmon-colored mucosa classified as Barrett's stage                         C0-M2 per Prague criteria. Biopsied.                        - A single gastric polyp. Biopsied.                        - Small hiatal hernia.                        - Mucosal changes in the duodenum. Biopsied. Recommendation:        - Await pathology results.                        - Follow an antireflux regimen.                        - Continue present medications.                        - Return to GI clinic in 4 weeks.                        - The findings and recommendations were discussed with                         the patient.                        - The findings and recommendations were discussed with                         the patient's family.                        - Return  to primary care physician as previously                         scheduled. Procedure Code(s):     --- Professional ---                        [phone], Esophagogastroduodenoscopy, flexible,                         transoral; with biopsy, single or multiple Diagnosis Code(s):     --- Professional ---                        K22.70, Barrett's esophagus without dysplasia                        K31.7, Polyp of stomach and duodenum                         K44.9, Diaphragmatic hernia without obstruction or                         gangrene                        K31.89, Other diseases of stomach and duodenum                        R12, Heartburn CPT copyright 2019 American Medical Association. All rights reserved. The codes documented in this report are preliminary and upon coder review may  be revised to meet current compliance requirements.  [name], MD Margretta Sidle B. Bonna Gains MD, MD 08/25/2021 10:48:54 AM This report has been signed electronically. Number of Addenda: 0 Note Initiated On: 08/25/2021 10:19 AM Estimated Blood Loss:  Estimated blood loss: none.      [facility]

## 2021-08-25 NOTE — Anesthesia Postprocedure Evaluation (Signed)
Anesthesia Post Note  Patient: Phillip Finley  Procedure(s) Performed: COLONOSCOPY WITH PROPOFOL ESOPHAGOGASTRODUODENOSCOPY (EGD)  Patient location during evaluation: Endoscopy Anesthesia Type: General Level of consciousness: awake and alert and oriented Pain management: pain level controlled Vital Signs Assessment: post-procedure vital signs reviewed and stable Respiratory status: spontaneous breathing, nonlabored ventilation and respiratory function stable Cardiovascular status: blood pressure returned to baseline and stable Postop Assessment: no signs of nausea or vomiting Anesthetic complications: no   No notable events documented.   Last Vitals:  Vitals:   08/25/21 1130 08/25/21 1135  BP: (!) 107/53 (!) 109/55  Pulse:    Resp:    Temp:    SpO2:      Last Pain:  Vitals:   08/25/21 1135  TempSrc:   PainSc: 0-No pain                 Merwin Breden

## 2021-08-25 NOTE — Anesthesia Preprocedure Evaluation (Signed)
Anesthesia Evaluation  Patient identified by MRN, date of birth, ID band Patient awake    Reviewed: Allergy & Precautions, NPO status , Patient's Chart, lab work & pertinent test results  History of Anesthesia Complications Negative for: history of anesthetic complications  Airway Mallampati: II  TM Distance: >3 FB Neck ROM: Full    Dental  (+) Upper Dentures   Pulmonary neg sleep apnea, neg COPD, Current Smoker and Patient abstained from smoking.,    breath sounds clear to auscultation- rhonchi (-) wheezing      Cardiovascular Exercise Tolerance: Good (-) hypertension(-) CAD, (-) Past MI, (-) Cardiac Stents and (-) CABG  Rhythm:Regular Rate:Normal - Systolic murmurs and - Diastolic murmurs    Neuro/Psych neg Seizures PSYCHIATRIC DISORDERS Anxiety Depression negative neurological ROS     GI/Hepatic Neg liver ROS, GERD  ,  Endo/Other  negative endocrine ROSneg diabetes  Renal/GU negative Renal ROS     Musculoskeletal  (+) Arthritis ,   Abdominal (+) - obese,   Peds  Hematology negative hematology ROS (+)   Anesthesia Other Findings Past Medical History: No date: Anxiety No date: Depression No date: GERD (gastroesophageal reflux disease)   Reproductive/Obstetrics                             Anesthesia Physical Anesthesia Plan  ASA: 2  Anesthesia Plan: General   Post-op Pain Management:    Induction: Intravenous  PONV Risk Score and Plan: 0 and Propofol infusion  Airway Management Planned: Natural Airway  Additional Equipment:   Intra-op Plan:   Post-operative Plan:   Informed Consent: I have reviewed the patients History and Physical, chart, labs and discussed the procedure including the risks, benefits and alternatives for the proposed anesthesia with the patient or authorized representative who has indicated his/her understanding and acceptance.     Dental advisory  given  Plan Discussed with: CRNA and Anesthesiologist  Anesthesia Plan Comments:         Anesthesia Quick Evaluation

## 2021-08-25 NOTE — Transfer of Care (Signed)
Immediate Anesthesia Transfer of Care Note  Patient: Phillip Finley  Procedure(s) Performed: COLONOSCOPY WITH PROPOFOL ESOPHAGOGASTRODUODENOSCOPY (EGD)  Patient Location: PACU  Anesthesia Type:General  Level of Consciousness: sedated  Airway & Oxygen Therapy: Patient Spontanous Breathing  Post-op Assessment: Report given to RN and Post -op Vital signs reviewed and stable  Post vital signs: Reviewed and stable  Last Vitals:  Vitals Value Taken Time  BP 93/48 08/25/21 1126  Temp    Pulse 54 08/25/21 1126  Resp 33 08/25/21 1126  SpO2 97 % 08/25/21 1126    Last Pain:  Vitals:   08/25/21 1014  TempSrc: Temporal  PainSc: 0-No pain         Complications: No notable events documented.

## 2021-08-25 NOTE — H&P (Signed)
Phillip Antigua, MD [address] [place], [place], [place], [address] [address], [place], [place], [place], [address] Phone: [phone]  Fax: [phone]  Primary Care Physician:  Olin Hauser, DO   Pre-Procedure History & Physical: HPI:  Phillip Finley is a 50 y.o. male is here for a colonoscopy and EGD.   Past Medical History:  Diagnosis Date   Anxiety    Depression    GERD (gastroesophageal reflux disease)     Past Surgical History:  Procedure Laterality Date   NO PAST SURGERIES      Prior to Admission medications   Medication Sig Start Date End Date Taking? Authorizing Provider  esomeprazole (NEXIUM) 40 MG capsule Take 1 capsule (40 mg total) by mouth daily before breakfast. 08/04/21  Yes Karamalegos, Devonne Doughty, DO  gabapentin (NEURONTIN) 300 MG capsule gabapentin 300 mg capsule  1 capsule three times day   Yes [provider]  meloxicam (MOBIC) 15 MG tablet meloxicam 15 mg tablet  Take 1 tablet every day by oral route with meals.  daily with food    [provider]    Allergies as of 08/09/2021   (No Known Allergies)    Family History  Problem Relation Age of Onset   Heart disease Father    Heart attack Father 48   Hyperlipidemia Mother    Hypertension Mother     Social History   Socioeconomic History   Marital status: Married    Spouse name: Not on file   Number of children: Not on file   Years of education: Not on file   Highest education level: Not on file  Occupational History   Not on file  Tobacco Use   Smoking status: Every Day    Packs/day: 1.00    Years: 30.00    Pack years: 30.00    Types: Cigarettes   Smokeless tobacco: Never  Vaping Use   Vaping Use: Never used  Substance and Sexual Activity   Alcohol use: Yes    Comment: seldom   Drug use: No   Sexual activity: Yes    Partners: Female    Birth control/protection: None  Other Topics Concern   Not on file  Social History  Narrative   Not on file   Social Determinants of Health   Financial Resource Strain: Not on file  Food Insecurity: Not on file  Transportation Needs: Not on file  Physical Activity: Not on file  Stress: Not on file  Social Connections: Not on file  Intimate Partner Violence: Not on file    Review of Systems: See HPI, otherwise negative ROS  Constitutional: General:   Alert,  Well-developed, well-nourished, pleasant and cooperative in NAD BP (!) 150/77   Pulse 63   Temp (!) 96.8 F (36 C) (Temporal)   Resp 18   Ht 5\' 10"  (1.778 m)   Wt 90 kg   SpO2 100%   BMI 28.47 kg/m   Head: Normocephalic, atraumatic.   Eyes:  Sclera clear, no icterus.   Conjunctiva pink.   Mouth:  No deformity or lesions, oropharynx pink & moist.  Neck:  Supple, trachea midline  Respiratory: Normal respiratory effort  Gastrointestinal:  Soft, non-tender and non-distended without masses, hepatosplenomegaly or hernias noted.  No guarding or rebound tenderness.     Cardiac: No clubbing or edema.  No cyanosis. Normal posterior tibial pedal pulses noted.  Lymphatic:  No significant cervical adenopathy.  Psych:  Alert and cooperative. Normal mood and affect.  Musculoskeletal:   Symmetrical without gross deformities. 5/5 Lower extremity strength bilaterally.  Skin: Warm. Intact without significant lesions or rashes. No jaundice.  Neurologic:  Face symmetrical, tongue midline, Normal sensation to touch;  grossly normal neurologically.  Psych:  Alert and oriented x3, Alert and cooperative. Normal mood and affect.  Impression/Plan: Phillip Finley is here for a colonoscopy to be performed for average risk screening and EGD for Acid Reflux, barretts esophagus.  This pt was referred by his PCP for history of barretts esophagus. He was never seen in our clinic and was directly scheduled for these procedures today by scheduling staff. Pt was seen in 2017 by [facility] clinic  GI which mentions  previous history of barretts esophagus and they were planning on repeat EGD at the time for barretts surveillance but this procedure was cancelled by the patient at that time.    Risks, benefits, limitations, and alternatives regarding the procedures have been reviewed with the patient.  Questions have been answered.  All parties agreeable.   Virgel Manifold, MD  08/25/2021, 10:28 AM

## 2021-08-26 ENCOUNTER — Encounter: Payer: Self-pay | Admitting: Gastroenterology

## 2021-08-26 LAB — SURGICAL PATHOLOGY

## 2021-08-30 ENCOUNTER — Encounter: Payer: Self-pay | Admitting: Gastroenterology

## 2021-10-27 ENCOUNTER — Ambulatory Visit (INDEPENDENT_AMBULATORY_CARE_PROVIDER_SITE_OTHER): Payer: Medicaid Other | Admitting: Gastroenterology

## 2021-10-27 ENCOUNTER — Encounter: Payer: Self-pay | Admitting: Gastroenterology

## 2021-10-27 VITALS — BP 123/77 | HR 61 | Temp 98.4°F | Ht 69.0 in | Wt 202.0 lb

## 2021-10-27 DIAGNOSIS — K2270 Barrett's esophagus without dysplasia: Secondary | ICD-10-CM | POA: Diagnosis not present

## 2021-10-27 DIAGNOSIS — R109 Unspecified abdominal pain: Secondary | ICD-10-CM | POA: Diagnosis not present

## 2021-10-27 MED ORDER — ESOMEPRAZOLE MAGNESIUM 20 MG PO CPDR: 20.0000 mg | DELAYED_RELEASE_CAPSULE | Freq: Every day | ORAL | 0 refills | Status: DC

## 2021-10-27 NOTE — Progress Notes (Signed)
[name], MD [address]  [place]  [place], [place] [address]  Main: [phone]  Fax: [phone]   Primary Care Physician: Olin Hauser, DO   Chief Complaint  Patient presents with   Follow-up    Barrett's esophagus    HPI: Phillip Finley is a 50 y.o. male recently seen as an outpatient for EGD and colonoscopy. y.o. male recently seen as an outpatient for EGD and colonoscopy.  Patient has never been seen in clinic and was scheduled as an outpatient directly.  As per H&P  "This pt was referred by his PCP for history of barretts esophagus. He was never seen in our clinic and was directly scheduled for these procedures today by scheduling staff. Pt was seen in 2017 by [facility] clinic  GI which mentions previous history of barretts esophagus and they were planning on repeat EGD at the time for barretts surveillance but this procedure was cancelled by the patient at that time."  Patient underwent EGD on 08/25/2021.  This showed C0M2 Barrett's.  Small hiatal hernia.  Mucosal changes in the duodenal nodule biopsied.  Colonoscopy showed a fair prep, several polyps removed, largest being 12 mm in size.  Nonbleeding internal hemorrhoids noted.  Pathology showed peptic duodenitis.  Intestinal metaplasia without dysplasia and esophagus.  Gastric polyps showed minute fundic gland polyp.  Colon polyps were tubular adenoma and sessile serrated polyps and hyperplastic polyp.  Repeat recommended in 1 year with 2-day prep  Patient has been on Nexium 40 mg once a day chronically and describes breakthrough symptoms only when laying down.  Also describes bilateral upper quadrant abdominal discomfort pain, especially when laying down.  Not always without meals.  No nausea or vomiting.  Dull, 5/10, nonradiating.  Recent labs reassuring as below  ROS: All ROS reviewed and negative except as per HPI   Past Medical History:  Diagnosis Date   Anxiety    Depression    GERD (gastroesophageal reflux disease)     Past Surgical  History:  Procedure Laterality Date   COLONOSCOPY WITH PROPOFOL N/A 08/25/2021   Procedure: COLONOSCOPY WITH PROPOFOL;  Surgeon: Virgel Manifold, MD;  Location: [facility] ENDOSCOPY;  Service: Endoscopy;  Laterality: N/A;   ESOPHAGOGASTRODUODENOSCOPY N/A 08/25/2021   Procedure: ESOPHAGOGASTRODUODENOSCOPY (EGD);  Surgeon: Virgel Manifold, MD;  Location: [facility] ENDOSCOPY;  Service: Endoscopy;  Laterality: N/A;   NO PAST SURGERIES      Prior to Admission medications   Medication Sig Start Date End Date Taking? Authorizing Provider  esomeprazole (NEXIUM) 20 MG capsule Take 1 capsule (20 mg total) by mouth daily at 12 noon. 10/27/21  Yes Virgel Manifold, MD  gabapentin (NEURONTIN) 300 MG capsule gabapentin 300 mg capsule  1 capsule three times day   Yes [provider]  meloxicam (MOBIC) 15 MG tablet meloxicam 15 mg tablet  Take 1 tablet every day by oral route with meals.  daily with food   Yes [provider]    Family History  Problem Relation Age of Onset   Heart disease Father    Heart attack Father 24   Hyperlipidemia Mother    Hypertension Mother      Social History   Tobacco Use   Smoking status: Every Day    Packs/day: 1.00    Years: 30.00    Pack years: 30.00    Types: Cigarettes   Smokeless tobacco: Never  Vaping Use   Vaping Use: Never used  Substance Use Topics   Alcohol use: Yes    Comment: seldom  Drug use: No    Allergies as of 10/27/2021   (No Known Allergies)    Physical Examination:  Constitutional: General:   Alert,  Well-developed, well-nourished, pleasant and cooperative in NAD BP 123/77   Pulse 61   Temp 98.4 F (36.9 C) (Oral)   Ht 5\' 9"  (1.753 m)   Wt 202 lb (91.6 kg)   BMI 29.83 kg/m   Respiratory: Normal respiratory effort  Gastrointestinal:  Soft, tender to palpation bilateral upper quadrant, and obese abdomen without masses, hepatosplenomegaly or hernias noted.  No guarding or rebound tenderness.      Cardiac: No clubbing or edema.  No cyanosis. Normal posterior tibial pedal pulses noted.  Psych:  Alert and cooperative. Normal mood and affect.  Musculoskeletal:  Normal gait. Head normocephalic, atraumatic. Symmetrical without gross deformities. 5/5 Lower extremity strength bilaterally.  Skin: Warm. Intact without significant lesions or rashes. No jaundice.  Neck: Supple, trachea midline  Lymph: No cervical lymphadenopathy  Psych:  Alert and oriented x3, Alert and cooperative. Normal mood and affect.  Labs: CMP     Component Value Date/Time   NA 137 08/08/2021 2149   K 4.1 08/08/2021 2149   CL 102 08/08/2021 2149   CO2 28 08/08/2021 2149   GLUCOSE 119 (H) 08/08/2021 2149   BUN 11 08/08/2021 2149   CREATININE 1.18 08/08/2021 2149   CREATININE 0.93 01/20/2021 0913   CALCIUM 9.2 08/08/2021 2149   PROT 7.2 08/08/2021 2149   ALBUMIN 4.5 08/08/2021 2149   AST 17 08/08/2021 2149   ALT 20 08/08/2021 2149   ALKPHOS 57 08/08/2021 2149   BILITOT 0.8 08/08/2021 2149   GFRNONAA >60 08/08/2021 2149   GFRNONAA 95 01/20/2021 0913   GFRAA 111 01/20/2021 0913   Lab Results  Component Value Date   WBC 6.8 08/08/2021   HGB 15.3 08/08/2021   HCT 41.2 08/08/2021   MCV 93.0 08/08/2021   PLT 184 08/08/2021    Imaging Studies:   Assessment and Plan:   Phillip Finley is a 50 y.o. y/o male who has never been seen in our clinic before and was directly scheduled as an outpatient for EGD for follow-up of Barrett's esophagus, and colonoscopy for screening is here in clinic today to discuss his history of Barrett's  We discussed Barrett's in detail and options of ongoing surveillance for Barrett's esophagus, versus no further repeat upper endoscopy.  Patient prefers to continue surveillance and repeat is recommended in 3 years  He is having breakthrough symptoms only when laying down and is also describing bilateral upper quadrant pain especially when laying down, but does have pain  on palpation today.  Proceed with CT abdomen pelvis for evaluation of abdominal pain  Patient educated on anti-reflux lifestyle modification, including using a bed wedge, not eating 3 hrs before bedtime, diet modifications, and handout given for the same.   Patient has been on Nexium chronically, at a dose of 40 mg once a day.  Patient willing to try decreasing the dose to 20 mg once a day.  Patient with Barrett's esophagus I recommended to be on PPI therapy indefinitely and this was discussed with him as well.  If symptoms worsen on lower dose, patient advised to call us  (Risks of PPI use were discussed with patient including bone loss, C. Diff diarrhea, pneumonia, infections, CKD, electrolyte abnormalities.  Pt. Verbalizes understanding and chooses to continue the medication.)  Recall has been set for a colonoscopy for 1 year with 2-day prep in his  chart   Dr [name]

## 2021-11-23 ENCOUNTER — Ambulatory Visit: Admission: RE | Admit: 2021-11-23 | Payer: Medicaid Other | Source: Ambulatory Visit

## 2021-11-24 ENCOUNTER — Telehealth: Payer: Self-pay

## 2021-11-24 NOTE — Telephone Encounter (Signed)
Documented the approval in the referral

## 2021-11-24 NOTE — Telephone Encounter (Signed)
Insurance company denied the CT scan. They would like a ultrasound to be done first before the ct scan is preform. If you want the CT scan to be done then you can call [phone] to talk to a provider about the case. Case ID number is [id number]

## 2021-11-26 NOTE — Telephone Encounter (Signed)
Left detailed msg on VM per HIPAA # for central scheduling given to pt

## 2021-12-10 ENCOUNTER — Ambulatory Visit
Admission: RE | Admit: 2021-12-10 | Discharge: 2021-12-10 | Disposition: A | Discharge status: Home / Self Care | Payer: Medicaid Other | Source: Ambulatory Visit | Attending: Gastroenterology | Admitting: Gastroenterology

## 2021-12-10 ENCOUNTER — Other Ambulatory Visit: Payer: Self-pay

## 2021-12-10 ENCOUNTER — Other Ambulatory Visit: Payer: Self-pay | Admitting: Family Medicine

## 2021-12-10 DIAGNOSIS — K2270 Barrett's esophagus without dysplasia: Secondary | ICD-10-CM

## 2021-12-10 DIAGNOSIS — R109 Unspecified abdominal pain: Secondary | ICD-10-CM | POA: Insufficient documentation

## 2021-12-10 DIAGNOSIS — K760 Fatty (change of) liver, not elsewhere classified: Secondary | ICD-10-CM | POA: Diagnosis not present

## 2021-12-10 LAB — POCT I-STAT CREATININE: Creatinine, Ser: 1.1 mg/dL (ref 0.61–1.24)

## 2021-12-10 MED ORDER — IOHEXOL 300 MG/ML  SOLN
100.0000 mL | Freq: Once | INTRAMUSCULAR | Status: AC | PRN
  Administered 2021-12-10: 100 mL via INTRAVENOUS

## 2021-12-10 NOTE — Telephone Encounter (Signed)
Dose was changed on 10/27/21  Requested Prescriptions  Pending Prescriptions Disp Refills   esomeprazole ([place]) 40 MG capsule [Pharmacy Med Name: ESOMEPRAZOLE MAGNESIUM 40MG  DR CAPS] 30 capsule 3    Sig: TAKE 1 CAPSULE(40 MG) BY MOUTH DAILY BEFORE AND BREAKFAST     Gastroenterology: Proton Pump Inhibitors Passed - 12/10/2021  9:48 AM      Passed - Valid encounter within last 12 months    Recent Outpatient Visits          4 months ago Barrett's esophagus without dysplasia   [place], DO   5 months ago Osteoarthritis of spine with radiculopathy, cervical region   [place], DO   10 months ago Annual physical exam   [place], DO   1 year ago Moderate episode of recurrent major depressive disorder [facility])   [facility] Olin Hauser, DO   1 year ago Annual physical exam   [place], DO      Future Appointments            In 1 month V[place], Tally Due, MD [place]   In 1 month [place], Devonne Doughty, [facility]

## 2021-12-13 ENCOUNTER — Encounter: Payer: Self-pay | Admitting: Gastroenterology

## 2021-12-13 DIAGNOSIS — K8020 Calculus of gallbladder without cholecystitis without obstruction: Secondary | ICD-10-CM

## 2021-12-20 NOTE — Telephone Encounter (Signed)
Placed referral  

## 2021-12-24 ENCOUNTER — Other Ambulatory Visit: Payer: Self-pay

## 2021-12-24 ENCOUNTER — Ambulatory Visit: Payer: Medicaid Other | Admitting: Surgery

## 2021-12-24 ENCOUNTER — Encounter: Payer: Self-pay | Admitting: Surgery

## 2021-12-24 VITALS — BP 136/79 | HR 69 | Temp 98.0°F | Ht 70.0 in | Wt 201.8 lb

## 2021-12-24 DIAGNOSIS — K8020 Calculus of gallbladder without cholecystitis without obstruction: Secondary | ICD-10-CM

## 2021-12-24 DIAGNOSIS — R1013 Epigastric pain: Secondary | ICD-10-CM | POA: Diagnosis not present

## 2021-12-24 NOTE — Patient Instructions (Addendum)
We will call you later today with the date and time of the Gastric Emptying study information.   We will call you with the results of the study and the next steps.  If the study is positive we will get you back with Dr Marius Ditch for follow up and treatment.  If the study is negative we will have you come back in to discuss getting your surgery scheduled.    Cholelithiasis Cholelithiasis is a disease in which gallstones form in the gallbladder. The gallbladder is an organ that stores bile. Bile is a fluid that helps to digest fats. Gallstones begin as small crystals and can slowly grow into stones. They may cause no symptoms until they block the gallbladder duct, or cystic duct, when the gallbladder tightens (contracts) after food is eaten. This can cause pain and is known as a gallbladder attack, or biliary colic. There are two main types of gallstones: Cholesterol stones. These are the most common type of gallstone. These stones are made of hardened cholesterol and are usually yellow-green in color. Cholesterol is a fat-like substance that is made in the liver. Pigment stones. These are dark in color and are made of a red-yellow substance, called bilirubin,that forms when hemoglobin from red blood cells breaks down. What are the causes? This condition may be caused by an imbalance in the different parts that make bile. This can happen if the bile: Has too much bilirubin. This can happen in certain blood diseases, such as sickle cell anemia. Has too much cholesterol. Does not have enough bile salts. These salts help the body absorb and digest fats. In some cases, this condition can also be caused by the gallbladder not emptying completely or often enough. This is common during pregnancy. What increases the risk? The following factors may make you more likely to develop this condition: Being male. Having multiple pregnancies. Health care providers sometimes advise removing diseased gallbladders  before future pregnancies. Eating a diet that is heavy in fried foods, fat, and refined carbohydrates, such as white bread and white rice. Being obese. Being older than age 34. Using medicines that contain male hormones (estrogen) for a long time. Losing weight quickly. Having a family history of gallstones. Having certain medical problems, such as: Diabetes mellitus. Cystic fibrosis. Crohn's disease. Cirrhosis or other long-term (chronic) liver disease. Certain blood diseases, such as sickle cell anemia or leukemia. What are the signs or symptoms? In many cases, having gallstones causes no symptoms. When you have gallstones but do not have symptoms, you have silent gallstones. If a gallstone blocks your bile duct, it can cause a gallbladder attack. The main symptom of a gallbladder attack is sudden pain in the upper right part of the abdomen. The pain: Usually comes at night or after eating. Can last for one hour or more. Can spread to your right shoulder, back, or chest. Can feel like indigestion. This is discomfort, burning, or fullness in your upper abdomen. If the bile duct is blocked for more than a few hours, it can cause an infection or inflammation of your gallbladder (cholecystitis), liver, or pancreas. This can cause: Nausea or vomiting. Bloating. Pain in your abdomen that lasts for 5 hours or longer. Tenderness in your upper abdomen, often in the upper right section and under your rib cage. Fever or chills. Skin or the white parts of your eyes turning yellow (jaundice). This usually happens when a stone has blocked bile from passing through the common bile duct. Dark urine or light-colored  stools. How is this diagnosed? This condition may be diagnosed based on: A physical exam. Your medical history. Ultrasound. CT scan. MRI. You may also have other tests, including: Blood tests to check for signs of an infection or inflammation. Cholescintigraphy, or HIDA scan. This  is a scan of your gallbladder and bile ducts (biliary system) using non-harmful radioactive material and special cameras that can see the radioactive material. Endoscopic retrograde cholangiopancreatogram. This involves inserting a small tube with a camera on the end (endoscope) through your mouth to look at bile ducts and check for blockages. How is this treated? Treatment for this condition depends on the severity of the condition. Silent gallstones do not need treatment. Treatment may be needed if a blockage causes a gallbladder attack or other symptoms. Treatment may include: Home care, if symptoms are not severe. During a simple gallbladder attack, stop eating and drinking for 12-24 hours (except for water and clear liquids). This helps to "cool down" your gallbladder. After 1 or 2 days, you can start to eat a diet of simple or clear foods, such as broths and crackers. You may also need medicines for pain or nausea or both. If you have cholecystitis and an infection, you will need antibiotics. A hospital stay, if needed for pain control or for cholecystitis with severe infection. Cholecystectomy, or surgery to remove your gallbladder. This is the most common treatment if all other treatments have not worked. Medicines to break up gallstones. These are most effective at treating small gallstones. Medicines may be used for up to 6-12 months. Endoscopic retrograde cholangiopancreatogram. A small basket can be attached to the endoscope and used to capture and remove gallstones, mainly those that are in the common bile duct. Follow these instructions at home: Medicines Take over-the-counter and prescription medicines only as told by your health care provider. If you were prescribed an antibiotic medicine, take it as told by your health care provider. Do not stop taking the antibiotic even if you start to feel better. Ask your health care provider if the medicine prescribed to you requires you to  avoid driving or using machinery. Eating and drinking Drink enough fluid to keep your urine pale yellow. This is important during a gallbladder attack. Water and clear liquids are preferred. Follow a healthy diet. This includes: Reducing fatty foods, such as fried food and foods high in cholesterol. Reducing refined carbohydrates, such as white bread and white rice. Eating more fiber. Aim for foods such as almonds, fruit, and beans. Alcohol use If you drink alcohol: Limit how much you use to: 0-1 drink a day for nonpregnant women. 0-2 drinks a day for men. Be aware of how much alcohol is in your drink. In the [place] one drink equals one 12 oz bottle of beer (355 mL), one 5 oz glass of wine (148 mL), or one 1 oz glass of hard liquor (44 mL). General instructions Do not use any products that contain nicotine or tobacco, such as cigarettes, e-cigarettes, and chewing tobacco. If you need help quitting, ask your health care provider. Maintain a healthy weight. Keep all follow-up visits as told by your health care provider. These may include consultations with a surgeon or specialist. This is important. Where to find more information [employer] of Diabetes and Digestive and Kidney Diseases: [email or web address] Contact a health care provider if: You think you have had a gallbladder attack. You have been diagnosed with silent gallstones and you develop pain in your abdomen or indigestion. You begin to  have attacks more often. You have dark urine or light-colored stools. Get help right away if: You have pain from a gallbladder attack that lasts for more than 2 hours. You have pain in your abdomen that lasts for more than 5 hours or is getting worse. You have a fever or chills. You have nausea and vomiting that do not go away. You develop jaundice. Summary Cholelithiasis is a disease in which gallstones form in the gallbladder. This condition may be caused by an imbalance in the different  parts that make bile. This can happen if your bile has too much bilirubin or cholesterol, or does not have enough bile salts. Treatment for gallstones depends on the severity of the condition. Silent gallstones do not need treatment. If gallstones cause a gallbladder attack or other symptoms, treatment usually involves not eating or drinking anything. Treatment may also include pain medicines and antibiotics, and it sometimes includes a hospital stay. Surgery to remove the gallbladder is common if all other treatments have not worked. This information is not intended to replace advice given to you by your health care provider. Make sure you discuss any questions you have with your health care provider. Document Revised: 09/30/2019 Document Reviewed: 09/30/2019 Elsevier Patient Education  2022 [employer].

## 2021-12-24 NOTE — Progress Notes (Addendum)
12/24/2021  Reason for Visit:  Cholelithiasis, epigastric pain  Requesting Provider:  Sherri Sear, MD  History of Present Illness: Phillip Finley is a 51 y.o. male presenting for evaluation of epigastric abdominal pain.  The patient reports that he has been dealing with this discomfort for a long time but more recently has become more frequent or aggravating.  Reports that the pain is in the epigastric area mostly in the center of the upper abdomen with sometimes radiation towards the center of his back.  Reports some occasional nausea but denies any specific correlation with the pain and nausea.  Denies any correlation with the pain and p.o. intake reports that eating does not necessarily aggravate the pain.  Denies any fevers, chills, chest pain, shortness of breath.  Reports having a history of Barrett's esophagus and reflux but reports that the symptoms from that are very different from the symptoms that he is having in the epigastric area.  He had an endoscopy with Dr. Bonna Gains on 08/25/2021 which confirmed the Barrett's esophagus without any dysplasia noted on biopsy and a single gastric polyp with some evidence of prior bleed but otherwise no other significant changes.  He was taking an antacid daily and recently had decrease the dose and reports that over the last week he has not taken the medication as he noted that it has not really helped with this discomfort.  He has been taking some ibuprofen to help with the epigastric discomfort.  Due to the symptoms, he had a CT scan of the abdomen and pelvis on 12/10/2021 which showed a contracted gallbladder containing several stones without any changes reflecting cholecystitis.  No hiatal hernia was found on the CT scan but he was fasting for this CT scan and on the imaging, his stomach is full of food debris and is somewhat distended.  Past Medical History: Past Medical History:  Diagnosis Date   Anxiety    Barrett's esophagus    Depression     GERD (gastroesophageal reflux disease)      Past Surgical History: Past Surgical History:  Procedure Laterality Date   BONE MARROW ASPIRATION     COLONOSCOPY WITH PROPOFOL N/A 08/25/2021   Procedure: COLONOSCOPY WITH PROPOFOL;  Surgeon: Virgel Manifold, MD;  Location: [facility] ENDOSCOPY;  Service: Endoscopy;  Laterality: N/A;   ESOPHAGOGASTRODUODENOSCOPY N/A 08/25/2021   Procedure: ESOPHAGOGASTRODUODENOSCOPY (EGD);  Surgeon: Virgel Manifold, MD;  Location: [facility] ENDOSCOPY;  Service: Endoscopy;  Laterality: N/A;   nerve ablasion     In neck   NO PAST SURGERIES      Home Medications: Prior to Admission medications   Medication Sig Start Date End Date Taking? Authorizing Provider  esomeprazole (NEXIUM) 20 MG capsule Take 1 capsule (20 mg total) by mouth daily at 12 noon. Patient not taking: Reported on 12/24/2021 10/27/21   Virgel Manifold, MD  gabapentin (NEURONTIN) 300 MG capsule gabapentin 300 mg capsule  1 capsule three times day Patient not taking: Reported on 12/24/2021    [provider]  meloxicam (MOBIC) 15 MG tablet meloxicam 15 mg tablet  Take 1 tablet every day by oral route with meals.  daily with food Patient not taking: Reported on 12/24/2021    [provider]    Allergies: No Known Allergies  Social History:  reports that he has been smoking cigarettes. He has a 30.00 pack-year smoking history. He has never used smokeless tobacco. He reports current alcohol use. He reports that he does not use drugs.  Family History: Family History  Problem Relation Age of Onset   Heart disease Father    Heart attack Father 66   Hyperlipidemia Mother    Hypertension Mother     Review of Systems: Review of Systems  Constitutional:  Negative for chills and fever.  HENT:  Negative for hearing loss.   Respiratory:  Negative for shortness of breath.   Cardiovascular:  Negative for chest pain.  Gastrointestinal:  Positive for abdominal pain and  nausea. Negative for constipation, diarrhea and vomiting.  Genitourinary:  Negative for dysuria.  Musculoskeletal:  Positive for back pain. Negative for myalgias.  Skin:  Negative for rash.  Neurological:  Negative for dizziness.  Psychiatric/Behavioral:  Negative for depression.    Physical Exam BP 136/79    Pulse 69    Temp 98 F (36.7 C) (Oral)    Ht 5\' 10"  (1.778 m)    Wt 201 lb 12.8 oz (91.5 kg)    SpO2 97%    BMI 28.96 kg/m  CONSTITUTIONAL: No acute distress, well-nourished HEENT:  Normocephalic, atraumatic, extraocular motion intact. NECK: Trachea is midline, and there is no jugular venous distension.  RESPIRATORY:  Lungs are clear, and breath sounds are equal bilaterally. Normal respiratory effort without pathologic use of accessory muscles. CARDIOVASCULAR: Heart is regular without murmurs, gallops, or rubs. GI: The abdomen is soft, nondistended, with some tenderness to palpation in the epigastric area as well as the right upper quadrant.  Negative Murphy's sign.  His pain is worse in the epigastric compared to right upper quadrant areas.  No abdominal hernias but the patient does have narrow diastases recti of the upper abdomen.   MUSCULOSKELETAL:  Normal muscle strength and tone in all four extremities.  No peripheral edema or cyanosis. SKIN: Skin turgor is normal. There are no pathologic skin lesions.  NEUROLOGIC:  Motor and sensation is grossly normal.  Cranial nerves are grossly intact. PSYCH:  Alert and oriented to person, place and time. Affect is normal.  Laboratory Analysis: Labs from 08/08/2021: Sodium 137, potassium 4.1, chloride 102, CO2 28, BUN 11, creatinine 1.18.  Total bilirubin 0.8, AST 17, ALT 20, alkaline phosphatase 57, lipase 36.  WBC 6.8, hemoglobin 15.3, hematocrit 41.2, platelets 184.  Imaging: CT abdomen/pelvis on 12/10/2021: IMPRESSION: 1. Cholelithiasis without wall thickening or pericholecystic fluid/stranding. 2. A tiny low-attenuation lesion in the  right hepatic lobe is too small to characterize but almost certainly benign such as a small cyst or hemangioma. 3. Diffuse hepatic steatosis. 4. Mild atherosclerosis in the nonaneurysmal abdominal aorta.  EGD on 08/25/2021: IMPRESSION: - Salmon-colored mucosa classified as Barrett's stage C0-M2 per [place] criteria. Biopsied. - A single gastric polyp. Biopsied. - Small hiatal hernia. - Mucosal changes in the duodenum. Biopsied.  Assessment and Plan: This is a 51 y.o. male with epigastric and right upper quadrant abdominal pain.  - Discussed with the patient that some of the findings on the CT scan with a contracted gallbladder with stones can be reflective of chronic cholecystitis which could be contributing to the patient's pain.  His symptoms are a little bit atypical and that there is no specific correlation with p.o. intake with his discomfort.  He does have a history of Barrett's esophagus and GERD and on his CT scan, the stomach does appear to be distended with food debris inside but the patient reports that prior to the CT scan, he had not had any solid intake for about 14 hours.  There is no evidence of small bowel dilatation  or obstruction and the contrast does flow through the small intestine without kinks.  As a precaution, discussed with the patient that it would be worthwhile to investigate his distended stomach to make sure that it is not a true cause of his epigastric abdominal pain.  Discussed with the patient that I will order a gastric emptying study to evaluate this further.  If the study is normal, then potentially his symptoms are more related to his gallbladder and we can follow-up with him to further discuss surgery and schedule.  However if the study does show an abnormality, then would refer back to Dr. Marius Ditch for further treatment. -Patient is understanding of this plan and agrees with the current plan of action.  All of his questions have been answered.  I spent 60 minutes  dedicated to the care of this patient on the date of this encounter to include pre-visit review of records, face-to-face time with the patient discussing diagnosis and management, and any post-visit coordination of care.   Melvyn Neth, [facility]

## 2021-12-30 ENCOUNTER — Other Ambulatory Visit: Payer: Medicaid Other

## 2021-12-31 ENCOUNTER — Other Ambulatory Visit: Payer: Self-pay

## 2021-12-31 ENCOUNTER — Emergency Department: Payer: Medicaid Other

## 2021-12-31 ENCOUNTER — Encounter: Payer: Self-pay | Admitting: Emergency Medicine

## 2021-12-31 ENCOUNTER — Ambulatory Visit
Admission: RE | Admit: 2021-12-31 | Discharge: 2021-12-31 | Disposition: A | Discharge status: Home / Self Care | Payer: Medicaid Other | Source: Ambulatory Visit | Attending: Surgery | Admitting: Surgery

## 2021-12-31 DIAGNOSIS — R1084 Generalized abdominal pain: Secondary | ICD-10-CM | POA: Diagnosis not present

## 2021-12-31 DIAGNOSIS — R0602 Shortness of breath: Secondary | ICD-10-CM | POA: Diagnosis not present

## 2021-12-31 DIAGNOSIS — R112 Nausea with vomiting, unspecified: Secondary | ICD-10-CM | POA: Diagnosis not present

## 2021-12-31 DIAGNOSIS — R1013 Epigastric pain: Secondary | ICD-10-CM | POA: Diagnosis not present

## 2021-12-31 LAB — BASIC METABOLIC PANEL
Anion gap: 10 (ref 5–15)
BUN: 11 mg/dL (ref 6–20)
CO2: 26 mmol/L (ref 22–32)
Calcium: 9.3 mg/dL (ref 8.9–10.3)
Chloride: 105 mmol/L (ref 98–111)
Creatinine, Ser: 1 mg/dL (ref 0.61–1.24)
GFR, Estimated: >60 mL/min (ref >60)
Glucose, Bld: 112 mg/dL — ABNORMAL HIGH (ref 70–99)
Potassium: 3.7 mmol/L (ref 3.5–5.1)
Sodium: 141 mmol/L (ref 135–145)

## 2021-12-31 LAB — CBC
HCT: 44.5 % (ref 39.0–52.0)
Hemoglobin: 15.6 g/dL (ref 13.0–17.0)
MCH: 33.4 pg (ref 26.0–34.0)
MCHC: 35.1 g/dL (ref 30.0–36.0)
MCV: 95.3 fL (ref 80.0–100.0)
Platelets: 199 10*3/uL (ref 150–400)
RBC: 4.67 MIL/uL (ref 4.22–5.81)
RDW: 11.7 % (ref 11.5–15.5)
WBC: 5.9 10*3/uL (ref 4.0–10.5)
nRBC: 0 % (ref 0.0–0.2)

## 2021-12-31 LAB — TROPONIN I (HIGH SENSITIVITY): Troponin I (High Sensitivity): 6 ng/L (ref <18)

## 2021-12-31 MED ORDER — TECHNETIUM TC 99M SULFUR COLLOID
2.0000 | Freq: Once | INTRAVENOUS | Status: AC | PRN
  Administered 2021-12-31: 2.65 via INTRAVENOUS

## 2021-12-31 NOTE — ED Triage Notes (Signed)
Pt presents to ER from home reports he had a nuclear test this morning and tonight about 30 minutes ago he feels a burning sensation in his chest, pt reports he feels cold and some shortness of breath. Pt talks in complete sentences no respiratory distress noted.

## 2022-01-01 ENCOUNTER — Emergency Department
Admission: EM | Admit: 2022-01-01 | Discharge: 2022-01-01 | Disposition: A | Discharge status: Home / Self Care | Payer: Medicaid Other | Attending: Emergency Medicine | Admitting: Emergency Medicine

## 2022-01-01 DIAGNOSIS — R1084 Generalized abdominal pain: Secondary | ICD-10-CM

## 2022-01-01 DIAGNOSIS — R112 Nausea with vomiting, unspecified: Secondary | ICD-10-CM

## 2022-01-01 LAB — TROPONIN I (HIGH SENSITIVITY): Troponin I (High Sensitivity): 5 ng/L (ref <18)

## 2022-01-01 MED ORDER — METOCLOPRAMIDE HCL 10 MG PO TABS: 10.0000 mg | ORAL_TABLET | Freq: Three times a day (TID) | ORAL | 1 refills | Status: DC | PRN

## 2022-01-01 NOTE — Discharge Instructions (Signed)

## 2022-01-01 NOTE — ED Provider Notes (Signed)
Phillip Finley Provider Note    Event Date/Time   First MD Initiated Contact with Patient 01/01/22 [phone]     (approximate)   History   Chest Pain   HPI  Phillip Finley is a 51 y.o. male with a history of chronic GI issues including Barrett's esophagus, hiatal hernia, and recent visits to gastroenterology and surgery for persistent episodes of abdominal pain.  He presents tonight with similar issues including gradual but acutely worsening onset of generalized abdominal pain that is aching and dull associated with nausea and vomiting.  He and his wife confirm that this has been going on for about a year and that he has had substantial weight loss.  Nothing in particular seems to make it better or worse and it happens at random times.  He had a visit to the emergency department and was found to have some gallstones.  He followed up with Dr. Hampton Abbot about a week ago and Dr. Hampton Abbot recommended doing an outpatient gastric emptying study because he felt that biliary colic may not explain the patient's symptoms.  The patient has also seen Dr. Bonna Gains in the past, as recently as October 27, 2021.  However he now is going to follow-up with Dr. Marius Ditch for gastroenterology care.  The patient had a gastric emptying study earlier in the day.  It seemed to go fine but then patient reports that the pain, nausea, and vomiting came back tonight.  He said that after coming to the emergency department he felt much better but the symptoms were quite severe earlier.     Physical Exam   Triage Vital Signs: ED Triage Vitals  Enc Vitals Group     BP 12/31/21 2246 (!) 157/89     Pulse Rate 12/31/21 2246 81     Resp 12/31/21 2246 (!) 24     Temp 12/31/21 2246 97.6 F (36.4 C)     Temp Source 12/31/21 2246 Oral     SpO2 12/31/21 2246 100 %     Weight 12/31/21 2250 90.7 kg (200 lb)     Height 12/31/21 2250 1.778 m (5\' 10" )     Head Circumference --      Peak Flow --      Pain  Score 12/31/21 2250 0     Pain Loc --      Pain Edu? --      Excl. in [place]? --     Most recent vital signs: Vitals:   01/01/22 0157 01/01/22 0339  BP: 123/74 119/73  Pulse: (!) 59 62  Resp: 20 16  Temp:    SpO2: 99% 96%     General: Awake, no distress.  CV:  Good peripheral perfusion.  Resp:  Normal effort.  Abd:  No distention.  No tenderness to palpation with no rebound and no guarding.   ED Results / Procedures / Treatments   Labs (all labs ordered are listed, but only abnormal results are displayed) Labs Reviewed  BASIC METABOLIC PANEL - Abnormal; Notable for the following components:      Result Value   Glucose, Bld 112 (*)    All other components within normal limits  CBC  TROPONIN I (HIGH SENSITIVITY)  TROPONIN I (HIGH SENSITIVITY)     EKG  ED ECG REPORT I, Hinda Kehr, the attending physician, personally viewed and interpreted this ECG.  Date: 12/31/2021 EKG Time: 22: 49 Rate: 84 Rhythm: normal sinus rhythm QRS Axis: normal Intervals: normal ST/T Wave abnormalities: normal  Narrative Interpretation: no evidence of acute ischemia    RADIOLOGY I personally reviewed the patient's two-view chest x-ray and see no evidence of any acute abnormality.  The radiologist report agrees.    PROCEDURES:  Critical Care performed: No  Procedures   MEDICATIONS ORDERED IN ED: Medications - No data to display   IMPRESSION / MDM / [place] / ED COURSE  I reviewed the triage vital signs and the nursing notes.                              Differential diagnosis includes, but is not limited to, ileus, partial bowel obstruction versus SBO, gastroparesis, biliary colic, much less likely ACS.  Patient's symptoms have resolved without intervention.  His episodes are very similar to the prior episodes.  He and his wife are understandably frustrated because they have been dealing with this for a long time, but there is no evidence that there is an acute  or emergent medical condition at this time.  I cannot currently see results of the gastric emptying study from yesterday but I can see that it occurred.  I was also able to read the surgical clinic note from Dr. Hampton Abbot on 12/24/2021 as well as the note from Dr. Bonna Gains on 10/27/2021.  Labs/studies ordered initially include basic metabolic panel, CBC, high-sensitivity troponin x2, EKG, and two-view chest x-ray.  As previously described, I personally reviewed the chest x-ray and there is no evidence of an acute abnormality.  I reviewed the EKG and there is no evidence of ischemia.  Vital signs are stable and within normal limits.  CBC is within normal limits including no leukocytosis, high-sensitivity troponin is within normal limits x2, and basic metabolic panel is normal.  I considered adding on hepatic function test and lipase, but the patient has no tenderness to palpation at this time and has had a GI work-up in the past, no indication to further evaluate at this time.  Very low suspicion for ACS.  Patient is comfortable with plan for discharge and outpatient follow-up with his existing providers.  I provided a prescription for Reglan which may help as an antiemetic and promotility agent during future episodes.  I gave my usual and customary return precautions.  There is no indication for repeat CT scan at this time and he and his wife understand and agree with the plan.         FINAL CLINICAL IMPRESSION(S) / ED DIAGNOSES   Final diagnoses:  Generalized abdominal pain  Nausea and vomiting, unspecified vomiting type     Rx / DC Orders   ED Discharge Orders          Ordered    metoCLOPramide (REGLAN) 10 MG tablet  Every 8 hours PRN        01/01/22 0335             Note:  This document was prepared using Dragon voice recognition software and may include unintentional dictation errors.   Hinda Kehr, MD 01/01/22 0500

## 2022-01-02 NOTE — Progress Notes (Signed)
01/02/22 Reviewed images and results of patient's gastric emptying study and released results to patient via [place].  Overall, stomach empties without any delays.  He presented to ED on 2/10 with similar symptoms after the study was done.  He has follow up with me on 2/15 and will discuss possible gallbladder surgery then.  Olean Ree, MD

## 2022-01-05 ENCOUNTER — Ambulatory Visit: Payer: Medicaid Other | Admitting: Surgery

## 2022-01-05 ENCOUNTER — Encounter: Payer: Self-pay | Admitting: Surgery

## 2022-01-05 ENCOUNTER — Other Ambulatory Visit: Payer: Self-pay

## 2022-01-05 VITALS — BP 134/77 | HR 83 | Temp 98.5°F | Wt 199.4 lb

## 2022-01-05 DIAGNOSIS — K8020 Calculus of gallbladder without cholecystitis without obstruction: Secondary | ICD-10-CM

## 2022-01-05 NOTE — Progress Notes (Signed)
01/05/2022  History of Present Illness: Phillip Finley is a 51 y.o. male presenting for follow-up of upper abdominal pain.  Patient was initially seen on 12/24/2021 for epigastric abdominal pain.  He had had an outpatient CAT scan with Dr. Marius Ditch on 12/10/2021 which showed cholelithiasis.  In that CT scan, his stomach seem to be distended and full of food although he had been fasting prior to the CT scan was done.  As a precaution, we obtain a gastric emptying study which was done on 12/31/2021.  This showed a normal function of the stomach.  However he presented to emergency room the same day due to worsening abdominal pain associated with nausea and vomiting in the epigastric area.  His work-up in the emergency room was unremarkable and was discharged home.  Today, the patient reports that he feels a lot better but does report that he has noticed that additional stress has been causing more of the pain issues.  He reports that he was doing well until earlier today that he had some issue that was very stressful and that caused some abdominal pain and now he is feeling better.  He is wondering if he needs to talk with his PCP in order to get medication for anxiety.  Currently he denies any abdominal pain, nausea, vomiting.  Past Medical History: Past Medical History:  Diagnosis Date   Anxiety    Barrett's esophagus    Depression    GERD (gastroesophageal reflux disease)      Past Surgical History: Past Surgical History:  Procedure Laterality Date   BONE MARROW ASPIRATION     COLONOSCOPY WITH PROPOFOL N/A 08/25/2021   Procedure: COLONOSCOPY WITH PROPOFOL;  Surgeon: Virgel Manifold, MD;  Location: [facility] ENDOSCOPY;  Service: Endoscopy;  Laterality: N/A;   ESOPHAGOGASTRODUODENOSCOPY N/A 08/25/2021   Procedure: ESOPHAGOGASTRODUODENOSCOPY (EGD);  Surgeon: Virgel Manifold, MD;  Location: [facility] ENDOSCOPY;  Service: Endoscopy;  Laterality: N/A;   nerve ablasion     In neck   NO PAST  SURGERIES      Home Medications: Prior to Admission medications   Medication Sig Start Date End Date Taking? Authorizing Provider  metoCLOPramide (REGLAN) 10 MG tablet Take 1 tablet (10 mg total) by mouth every 8 (eight) hours as needed for nausea or vomiting. Patient not taking: Reported on 01/05/2022 01/01/22 01/01/23  Hinda Kehr, MD    Allergies: No Known Allergies  Review of Systems: Review of Systems  Constitutional:  Negative for chills and fever.  Respiratory:  Negative for shortness of breath.   Cardiovascular:  Negative for chest pain.  Gastrointestinal:  Negative for abdominal pain, nausea and vomiting.   Physical Exam BP 134/77    Pulse 83    Temp 98.5 F (36.9 C) (Oral)    Wt 199 lb 6.4 oz (90.4 kg)    SpO2 96%    BMI 28.61 kg/m  CONSTITUTIONAL: No acute distress HEENT:  Normocephalic, atraumatic, extraocular motion intact. RESPIRATORY:  Lungs are clear, and breath sounds are equal bilaterally. Normal respiratory effort without pathologic use of accessory muscles. CARDIOVASCULAR: Heart is regular without murmurs, gallops, or rubs. GI: The abdomen is soft, nondistended, currently nontender to palpation.  NEUROLOGIC:  Motor and sensation is grossly normal.  Cranial nerves are grossly intact. PSYCH:  Alert and oriented to person, place and time. Affect is normal.  Labs/Imaging: Labs from 12/31/2021: Sodium 141, potassium 3.7, chloride 105, CO2 26, BUN 11, creatinine 1.0.  WBC 5.9, hemoglobin 15.6, hematocrit 44.5, platelets 199.  Chest x-ray on 12/31/2021: IMPRESSION: No active cardiopulmonary disease.  Gastric emptying study on 12/31/2021: IMPRESSION: Normal gastric emptying study.  Assessment and Plan: This is a 51 y.o. male with epigastric abdominal pain.  - Patient reports that his abdominal pain has been doing better and he feels that there is a component related to stress or anxiety that builds up to the abdominal pain.  He reports that the pain usually is  in the epigastric area going towards the left upper quadrant and very little in the right upper quadrant.  His CT scan was concerning for cholelithiasis with a contracted gallbladder around the gallstones and his gastric emptying study is negative.  At this point, potentially this could still be related to GERD and his Barrett's esophagus causing the discomfort.  Recent EGD on 08/25/2021 only showed Barrett's esophagus with some mild mucosal changes in the duodenum but a normal stomach otherwise. - At this point, the patient is unsure whether she will want to proceed with surgery or not he feels that there is a component to the anxiety or stress that may be causing some of the discomfort that he is having.  He would like to discuss with his PCP first and see if treating this may help with his symptoms or not.  He feels that he will probably end up calling us back to talk about surgery for his gallbladder but would rather go through this process first as a precaution.  I think is perfectly reasonable as his symptoms are somewhat atypical and I think will give [place] peace of mind that we are doing the surgery for the right reasons. -Patient will call us when he is ready to discuss further.  I spent 25 minutes dedicated to the care of this patient on the date of this encounter to include pre-visit review of records, face-to-face time with the patient discussing diagnosis and management, and any post-visit coordination of care.   Melvyn Neth, [facility]

## 2022-01-05 NOTE — Patient Instructions (Addendum)
If you have any concerns or questions, please feel free to call our office.   Minimally Invasive Cholecystectomy Minimally invasive cholecystectomy is surgery to remove the gallbladder. The gallbladder is a pear-shaped organ that lies beneath the liver on the right side of the body. The gallbladder stores bile, which is a fluid that helps the body digest fats. Cholecystectomy is often done to treat inflammation (irritation and swelling) of the gallbladder (cholecystitis). This condition is usually caused by a buildup of gallstones (cholelithiasis) in the gallbladder or when the fluid in the gall bladder becomes stagnant because gallstones get stuck in the ducts (tubes) and block the flow of bile. This can result in inflammation and pain. In severe cases, emergency surgery may be required. This procedure is done through small incisions in the abdomen, instead of one large incision. It is also called laparoscopic surgery. A thin scope with a camera (laparoscope) is inserted through one incision. Then surgical instruments are inserted through the other incisions. In some cases, a minimally invasive surgery may need to be changed to a surgery that is done through a larger incision. This is called open surgery. Tell a health care provider about: Any allergies you have. All medicines you are taking, including vitamins, herbs, eye drops, creams, and over-the-counter medicines. Any problems you or family members have had with anesthetic medicines. Any bleeding problems you have. Any surgeries you have had. Any medical conditions you have. Whether you are pregnant or may be pregnant. What are the risks? Generally, this is a safe procedure. However, problems may occur, including: Infection. Bleeding. Allergic reactions to medicines. Damage to nearby structures or organs. A gallstone remaining in the common bile duct. The common bile duct carries bile from the gallbladder to the small intestine. A bile leak  from the liver or cystic duct after your gallbladder is removed. What happens before the procedure? When to stop eating and drinking Follow instructions from your health care provider about what you may eat and drink before your procedure. These may include: 8 hours before the procedure Stop eating most foods. Do not eat meat, fried foods, or fatty foods. Eat only light foods, such as toast or crackers. All liquids are okay except energy drinks and alcohol. 6 hours before the procedure Stop eating. Drink only clear liquids, such as water, clear fruit juice, black coffee, plain tea, and sports drinks. Do not drink energy drinks or alcohol. 2 hours before the procedure Stop drinking all liquids. You may be allowed to take medicines with small sips of water. If you do not follow your health care provider's instructions, your procedure may be delayed or canceled. Medicines Ask your health care provider about: Changing or stopping your regular medicines. This is especially important if you are taking diabetes medicines or blood thinners. Taking medicines such as aspirin and ibuprofen. These medicines can thin your blood. Do not take these medicines unless your health care provider tells you to take them. Taking over-the-counter medicines, vitamins, herbs, and supplements. General instructions If you will be going home right after the procedure, plan to have a responsible adult: Take you home from the hospital or clinic. You will not be allowed to drive. Care for you for the time you are told. Do not use any products that contain nicotine or tobacco for at least 4 weeks before the procedure. These products include cigarettes, chewing tobacco, and vaping devices, such as e-cigarettes. If you need help quitting, ask your health care provider. Ask your health care  provider: How your surgery site will be marked. What steps will be taken to help prevent infection. These may include: Removing hair  at the surgery site. Washing skin with a germ-killing soap. Taking antibiotic medicine. What happens during the procedure?  An IV will be inserted into one of your veins. You will be given one or both of the following: A medicine to help you relax (sedative). A medicine to make you fall asleep (general anesthetic). Your surgeon will make several small incisions in your abdomen. The laparoscope will be inserted through one of the small incisions. The camera on the laparoscope will send images to a monitor in the operating room. This lets your surgeon see inside your abdomen. A gas will be pumped into your abdomen. This will expand your abdomen to give the surgeon more room to perform the surgery. Other tools that are needed for the procedure will be inserted through the other incisions. The gallbladder will be removed through one of the incisions. Your common bile duct may be examined. If stones are found in the common bile duct, they may be removed. After your gallbladder has been removed, the incisions will be closed with stitches (sutures), staples, or skin glue. Your incisions will be covered with a bandage (dressing). The procedure may vary among health care providers and hospitals. What happens after the procedure? Your blood pressure, heart rate, breathing rate, and blood oxygen level will be monitored until you leave the hospital or clinic. You will be given medicines as needed to control your pain. You may have a drain placed in the incision. The drain will be removed a day or two after the procedure. Summary Minimally invasive cholecystectomy, also called laparoscopic cholecystectomy, is surgery to remove the gallbladder using small incisions. Tell your health care provider about all the medical conditions you have and all the medicines you are taking for those conditions. Before the procedure, follow instructions about when to stop eating and drinking and changing or stopping  medicines. Plan to have a responsible adult care for you for the time you are told after you leave the hospital or clinic. This information is not intended to replace advice given to you by your health care provider. Make sure you discuss any questions you have with your health care provider. Document Revised: 05/11/2021 Document Reviewed: 05/11/2021 Elsevier Patient Education  [place].

## 2022-01-24 ENCOUNTER — Other Ambulatory Visit: Payer: Self-pay

## 2022-01-24 DIAGNOSIS — Z0000 Encounter for general adult medical examination without abnormal findings: Secondary | ICD-10-CM

## 2022-01-24 DIAGNOSIS — R7309 Other abnormal glucose: Secondary | ICD-10-CM

## 2022-01-24 DIAGNOSIS — Z125 Encounter for screening for malignant neoplasm of prostate: Secondary | ICD-10-CM

## 2022-01-24 DIAGNOSIS — F3341 Major depressive disorder, recurrent, in partial remission: Secondary | ICD-10-CM

## 2022-01-24 DIAGNOSIS — E782 Mixed hyperlipidemia: Secondary | ICD-10-CM

## 2022-01-24 DIAGNOSIS — R351 Nocturia: Secondary | ICD-10-CM

## 2022-01-25 ENCOUNTER — Other Ambulatory Visit: Payer: Medicaid Other

## 2022-01-25 ENCOUNTER — Other Ambulatory Visit: Payer: Self-pay

## 2022-01-25 ENCOUNTER — Ambulatory Visit: Payer: Medicaid Other | Admitting: Gastroenterology

## 2022-01-31 ENCOUNTER — Other Ambulatory Visit: Payer: Medicaid Other

## 2022-01-31 DIAGNOSIS — E782 Mixed hyperlipidemia: Secondary | ICD-10-CM | POA: Diagnosis not present

## 2022-01-31 DIAGNOSIS — F3341 Major depressive disorder, recurrent, in partial remission: Secondary | ICD-10-CM | POA: Diagnosis not present

## 2022-01-31 DIAGNOSIS — Z125 Encounter for screening for malignant neoplasm of prostate: Secondary | ICD-10-CM | POA: Diagnosis not present

## 2022-01-31 DIAGNOSIS — Z0000 Encounter for general adult medical examination without abnormal findings: Secondary | ICD-10-CM | POA: Diagnosis not present

## 2022-01-31 DIAGNOSIS — R351 Nocturia: Secondary | ICD-10-CM | POA: Diagnosis not present

## 2022-01-31 DIAGNOSIS — R7309 Other abnormal glucose: Secondary | ICD-10-CM | POA: Diagnosis not present

## 2022-02-01 ENCOUNTER — Encounter: Payer: Medicaid Other | Admitting: Family Medicine

## 2022-02-01 LAB — CBC WITH DIFFERENTIAL/PLATELET
Absolute Monocytes: 679 cells/uL (ref 200–950)
Basophils Absolute: 69 cells/uL (ref 0–200)
Basophils Relative: 0.8 %
Eosinophils Absolute: 249 cells/uL (ref 15–500)
Eosinophils Relative: 2.9 %
HCT: 46.9 % (ref 38.5–50.0)
Hemoglobin: 16 g/dL (ref 13.2–17.1)
Lymphs Abs: 3560 cells/uL (ref 850–3900)
MCH: 33.3 pg — ABNORMAL HIGH (ref 27.0–33.0)
MCHC: 34.1 g/dL (ref 32.0–36.0)
MCV: 97.5 fL (ref 80.0–100.0)
MPV: 10.2 fL (ref 7.5–12.5)
Monocytes Relative: 7.9 %
Neutro Abs: 4042 cells/uL (ref 1500–7800)
Neutrophils Relative %: 47 %
Platelets: 223 10*3/uL (ref 140–400)
RBC: 4.81 10*6/uL (ref 4.20–5.80)
RDW: 12.1 % (ref 11.0–15.0)
Total Lymphocyte: 41.4 %
WBC: 8.6 10*3/uL (ref 3.8–10.8)

## 2022-02-01 LAB — COMPLETE METABOLIC PANEL WITH GFR
AG Ratio: 1.9 (calc) (ref 1.0–2.5)
ALT: 20 U/L (ref 9–46)
AST: 14 U/L (ref 10–35)
Albumin: 4.6 g/dL (ref 3.6–5.1)
Alkaline phosphatase (APISO): 64 U/L (ref 35–144)
BUN: 10 mg/dL (ref 7–25)
CO2: 29 mmol/L (ref 20–32)
Calcium: 9.8 mg/dL (ref 8.6–10.3)
Chloride: 109 mmol/L (ref 98–110)
Creat: 1.04 mg/dL (ref 0.70–1.30)
Globulin: 2.4 g/dL (calc) (ref 1.9–3.7)
Glucose, Bld: 118 mg/dL — ABNORMAL HIGH (ref 65–99)
Potassium: 4.3 mmol/L (ref 3.5–5.3)
Sodium: 144 mmol/L (ref 135–146)
Total Bilirubin: 0.4 mg/dL (ref 0.2–1.2)
Total Protein: 7 g/dL (ref 6.1–8.1)
eGFR: 87 mL/min/{1.73_m2} (ref >=60)

## 2022-02-01 LAB — TSH: TSH: 2.32 mIU/L (ref 0.40–4.50)

## 2022-02-01 LAB — LIPID PANEL
Cholesterol: 208 mg/dL — ABNORMAL HIGH (ref <200)
HDL: 39 mg/dL — ABNORMAL LOW (ref >=40)
LDL Cholesterol (Calc): 136 mg/dL (calc) — ABNORMAL HIGH
Non-HDL Cholesterol (Calc): 169 mg/dL (calc) — ABNORMAL HIGH (ref <130)
Total CHOL/HDL Ratio: 5.3 (calc) — ABNORMAL HIGH (ref <5.0)
Triglycerides: 189 mg/dL — ABNORMAL HIGH (ref <150)

## 2022-02-01 LAB — HEMOGLOBIN A1C
Hgb A1c MFr Bld: 5.5 % of total Hgb (ref <5.7)
Mean Plasma Glucose: 111 mg/dL
eAG (mmol/L): 6.2 mmol/L

## 2022-02-01 LAB — PSA: PSA: 0.5 ng/mL (ref <=4.00)

## 2022-02-02 ENCOUNTER — Ambulatory Visit (INDEPENDENT_AMBULATORY_CARE_PROVIDER_SITE_OTHER): Payer: Medicaid Other | Admitting: Family Medicine

## 2022-02-02 ENCOUNTER — Encounter: Payer: Self-pay | Admitting: Family Medicine

## 2022-02-02 ENCOUNTER — Other Ambulatory Visit: Payer: Self-pay

## 2022-02-02 VITALS — BP 130/68 | HR 71 | Ht 70.0 in | Wt 199.0 lb

## 2022-02-02 DIAGNOSIS — Z0000 Encounter for general adult medical examination without abnormal findings: Secondary | ICD-10-CM

## 2022-02-02 DIAGNOSIS — F411 Generalized anxiety disorder: Secondary | ICD-10-CM | POA: Diagnosis not present

## 2022-02-02 DIAGNOSIS — R7309 Other abnormal glucose: Secondary | ICD-10-CM | POA: Diagnosis not present

## 2022-02-02 DIAGNOSIS — F3341 Major depressive disorder, recurrent, in partial remission: Secondary | ICD-10-CM

## 2022-02-02 DIAGNOSIS — E663 Overweight: Secondary | ICD-10-CM

## 2022-02-02 MED ORDER — BUSPIRONE HCL 5 MG PO TABS: 5.0000 mg | ORAL_TABLET | Freq: Two times a day (BID) | ORAL | 2 refills | Status: DC | PRN

## 2022-02-02 NOTE — Progress Notes (Signed)
? ?Subjective:  ? ? Patient ID: Phillip Finley, male    DOB: 1971-10-23, 51 y.o.   MRN: [id number] ? ?Phillip Finley is a 51 y.o. male presenting on 02/02/2022 for Annual Exam ? ? ?HPI ? ?Here for Annual Physical and Lab Review ? ?Elevated A1c ?A1c at 5.5, previous 5.5 to 5.8 ?Meds: None ?Lifestyle:  ?- Diet (improved) ?Weight down 1 lb ?He has cut back sodas, he does still eat carbs / starchy foods calories overall. ?- Exercise (Limited currently) ?Denies hypoglycemia, polyuria, visual changes, numbness or tingling. ?  ?HYPERLIPIDEMIA: ?- Reports no concerns. Last lipid panel 01/2022, TG still down 180s, prior >300 in past, LDL remains 130s ?Not on statin therapy. ?   ?Chronic Neck pain / OA DJD ?He has chronic problem. Reduced ROM ?Previously - on Gabapentin '300mg'$  x 2 = '600mg'$  nightly, needs refill ?He said ortho, want to do surgery he is hesitant ?  ?he is unsure about 2nd opinion. Doesn't want to do surgery. ?  ?Chronic Depression recurrent - remission ?Anxiety / Insomnia: ?He has weaned down off of Sertraline '100mg'$  daily over past >1-2 months. Now off medication and doing well. ?He also took Gabapentin '300mg'$  x 2 = '600mg'$  nightly for nec issue and also with insomnia. ?Admits anxiety still bothering him and he feels like if triggered can feel worse with anxiety  ? ? ?Additional history ? ?Cholelithiasis / GERD / Barrett's ?Followed by GI and Surgery Dr Hampton Abbot ?He has had negative gastric emptying study, CT showed cholelithiasis, he has some assoc symptoms of GERD / Barrett's esophagus. ?Decision was to hold on cholecystectomy at this time, he is still considering ?He came off Omeprazole and feels better ?Has had EGD done, with no change in Barrett's esophagus  ? ? ?Health Maintenance: ? ?PSA 0.50 (01/2022) ? ?Colonoscopy - multiple polyps done 08/2021, Dr Bonna Gains, repeat anticipated for 08/2022. ? ?Depression screen [facility] 2/9 02/02/2022 08/04/2021 06/28/2021  ?Decreased Interest '1 1 2  '$ ?Down, Depressed, Hopeless '1  1 1  '$ ?PHQ - 2 Score '2 2 3  '$ ?Altered sleeping '2 2 3  '$ ?Tired, decreased energy '3 2 3  '$ ?Change in appetite 1 0 0  ?Feeling bad or failure about yourself  '1 1 2  '$ ?Trouble concentrating 0 1 0  ?Moving slowly or fidgety/restless '1 1 1  '$ ?Suicidal thoughts 0 0 1  ?PHQ-9 Score '10 9 13  '$ ?Difficult doing work/chores Somewhat difficult Very difficult Somewhat difficult  ?Some recent data might be hidden  ? ?GAD 7 : Generalized Anxiety Score 02/02/2022 08/04/2021 06/28/2021 01/25/2021  ?Nervous, Anxious, on Edge '1 2 2 1  '$ ?Control/stop worrying '1 1 1 1  '$ ?Worry too much - different things '1 2 1 2  '$ ?Trouble relaxing '1 1 2 1  '$ ?Restless '2 1 1 1  '$ ?Easily annoyed or irritable '1 1 1 1  '$ ?Afraid - awful might happen 2 2 - 2  ?Total GAD 7 Score 9 10 - 9  ?Anxiety Difficulty Somewhat difficult Very difficult Somewhat difficult Somewhat difficult  ? ? ? ?Past Medical History:  ?Diagnosis Date  ? Anxiety   ? Barrett's esophagus   ? Depression   ? GERD (gastroesophageal reflux disease)   ? ?Past Surgical History:  ?Procedure Laterality Date  ? BONE MARROW ASPIRATION    ? COLONOSCOPY WITH PROPOFOL N/A 08/25/2021  ? Procedure: COLONOSCOPY WITH PROPOFOL;  Surgeon: Virgel Manifold, MD;  Location: [facility] ENDOSCOPY;  Service: Endoscopy;  Laterality: N/A;  ? ESOPHAGOGASTRODUODENOSCOPY N/A 08/25/2021  ?  Procedure: ESOPHAGOGASTRODUODENOSCOPY (EGD);  Surgeon: Virgel Manifold, MD;  Location: [facility] ENDOSCOPY;  Service: Endoscopy;  Laterality: N/A;  ? nerve ablasion    ? In neck  ? NO PAST SURGERIES    ? ?Social History  ? ?Socioeconomic History  ? Marital status: Married  ?  Spouse name: Not on file  ? Number of children: Not on file  ? Years of education: Not on file  ? Highest education level: Not on file  ?Occupational History  ? Not on file  ?Tobacco Use  ? Smoking status: Every Day  ?  Packs/day: 1.00  ?  Years: 30.00  ?  Pack years: 30.00  ?  Types: Cigarettes  ? Smokeless tobacco: Never  ?Vaping Use  ? Vaping Use: Never used  ?Substance and Sexual  Activity  ? Alcohol use: Yes  ?  Comment: seldom  ? Drug use: No  ? Sexual activity: Yes  ?  Partners: Female  ?  Birth control/protection: None  ?Other Topics Concern  ? Not on file  ?Social History Narrative  ? Not on file  ? ?Social Determinants of Health  ? ?Financial Resource Strain: Not on file  ?Food Insecurity: Not on file  ?Transportation Needs: Not on file  ?Physical Activity: Not on file  ?Stress: Not on file  ?Social Connections: Not on file  ?Intimate Partner Violence: Not on file  ? ?Family History  ?Problem Relation Age of Onset  ? Heart disease Father   ? Heart attack Father 38  ? Hyperlipidemia Mother   ? Hypertension Mother   ? ?No current outpatient medications on file prior to visit.  ? ?No current facility-administered medications on file prior to visit.  ? ? ?Review of Systems  ?Constitutional:  Negative for activity change, appetite change, chills, diaphoresis, fatigue and fever.  ?HENT:  Negative for congestion and hearing loss.   ?Eyes:  Negative for visual disturbance.  ?Respiratory:  Negative for cough, chest tightness, shortness of breath and wheezing.   ?Cardiovascular:  Negative for chest pain, palpitations and leg swelling.  ?Gastrointestinal:  Negative for abdominal pain, constipation, diarrhea, nausea and vomiting.  ?Genitourinary:  Negative for dysuria, frequency and hematuria.  ?Musculoskeletal:  Negative for arthralgias and neck pain.  ?Skin:  Negative for rash.  ?Neurological:  Negative for dizziness, weakness, light-headedness, numbness and headaches.  ?Hematological:  Negative for adenopathy.  ?Psychiatric/Behavioral:  Negative for behavioral problems, dysphoric mood and sleep disturbance.   ?Per HPI unless specifically indicated above ? ?   ?Objective:  ?  ?BP 130/68   Pulse 71   Ht '5\' 10"'$  (1.778 m)   Wt 199 lb (90.3 kg)   SpO2 100%   BMI 28.55 kg/m?   ?Wt Readings from Last 3 Encounters:  ?02/02/22 199 lb (90.3 kg)  ?01/05/22 199 lb 6.4 oz (90.4 kg)  ?12/31/21 200 lb  (90.7 kg)  ?  ?Physical Exam ?Vitals and nursing note reviewed.  ?Constitutional:   ?   General: He is not in acute distress. ?   Appearance: He is well-developed. He is not diaphoretic.  ?   Comments: Well-appearing, comfortable, cooperative  ?HENT:  ?   Head: Normocephalic and atraumatic.  ?Eyes:  ?   General:     ?   Right eye: No discharge.     ?   Left eye: No discharge.  ?   Conjunctiva/sclera: Conjunctivae normal.  ?   Pupils: Pupils are equal, round, and reactive to light.  ?Neck:  ?  Thyroid: No thyromegaly.  ?Cardiovascular:  ?   Rate and Rhythm: Normal rate and regular rhythm.  ?   Pulses: Normal pulses.  ?   Heart sounds: Normal heart sounds. No murmur heard. ?Pulmonary:  ?   Effort: Pulmonary effort is normal. No respiratory distress.  ?   Breath sounds: Normal breath sounds. No wheezing or rales.  ?Abdominal:  ?   General: Bowel sounds are normal. There is no distension.  ?   Palpations: Abdomen is soft. There is no mass.  ?   Tenderness: There is no abdominal tenderness.  ?Musculoskeletal:     ?   General: No tenderness. Normal range of motion.  ?   Cervical back: Normal range of motion and neck supple.  ?   Comments: Upper / Lower Extremities: ?- Normal muscle tone, strength bilateral upper extremities 5/5, lower extremities 5/5  ?Lymphadenopathy:  ?   Cervical: No cervical adenopathy.  ?Skin: ?   General: Skin is warm and dry.  ?   Findings: No erythema or rash.  ?Neurological:  ?   Mental Status: He is alert and oriented to person, place, and time.  ?   Comments: Distal sensation intact to light touch all extremities  ?Psychiatric:     ?   Mood and Affect: Mood normal.     ?   Behavior: Behavior normal.     ?   Thought Content: Thought content normal.  ?   Comments: Well groomed, good eye contact, normal speech and thoughts  ? ?Results for orders placed or performed in visit on 01/24/22  ?TSH  ?Result Value Ref Range  ? TSH 2.32 0.40 - 4.50 mIU/L  ?PSA  ?Result Value Ref Range  ? PSA 0.50 < OR =  4.00 ng/mL  ?Hemoglobin A1c  ?Result Value Ref Range  ? Hgb A1c MFr Bld 5.5 <5.7 % of total Hgb  ? Mean Plasma Glucose 111 mg/dL  ? eAG (mmol/L) 6.2 mmol/L  ?CBC with Differential/Platelet  ?Result Value Ref

## 2022-02-02 NOTE — Assessment & Plan Note (Signed)
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 ?Plan ?Off SSRI ?Follow-up in future if need return to Psych or other counseling/therapy ?

## 2022-02-02 NOTE — Patient Instructions (Addendum)
Thank you for coming to the office today. ? ?Keep up the great work ? ?Recent Labs  ?  07/29/21 ?0822 01/31/22 ?0843  ?HGBA1C 5.5 5.5  ? ?Mild elevated glucose and cholesterol. ? ?Keep improving diet. ? ?Take Buspar '5mg'$  twice a day as needed for anxiety and may take prior to stressful events if you can plan ahead. It can be taken short term as needed. Max is 3 times a day if need ? ?Return to Dr Hampton Abbot if needed or let me know if questions. ? ?DUE for FASTING BLOOD WORK (no food or drink after midnight before the lab appointment, only water or coffee without cream/sugar on the morning of) ? ?SCHEDULE "Lab Only" visit in the morning at the clinic for lab draw in 1 YEAR ? ?- Make sure Lab Only appointment is at about 1 week before your next appointment, so that results will be available ? ?For Lab Results, once available within 2-3 days of blood draw, you can can log in to MyChart online to view your results and a brief explanation. Also, we can discuss results at next follow-up visit. ? ? ? ?Please schedule a Follow-up Appointment to: Return in about 1 year (around 02/03/2023) for 1 year fasting lab then 1 week later Annual Physical. ? ?If you have any other questions or concerns, please feel free to call the office or send a message through [place]. You may also schedule an earlier appointment if necessary. ? ?Additionally, you may be receiving a survey about your experience at our office within a few days to 1 week by e-mail or mail. We value your feedback. ? ?Nobie Putnam, DO ?[facility] ?

## 2022-02-03 DIAGNOSIS — H5213 Myopia, bilateral: Secondary | ICD-10-CM | POA: Diagnosis not present

## 2022-03-15 DIAGNOSIS — K8020 Calculus of gallbladder without cholecystitis without obstruction: Secondary | ICD-10-CM | POA: Diagnosis not present

## 2022-03-15 DIAGNOSIS — R1013 Epigastric pain: Secondary | ICD-10-CM | POA: Diagnosis not present

## 2022-03-15 DIAGNOSIS — K2270 Barrett's esophagus without dysplasia: Secondary | ICD-10-CM | POA: Diagnosis not present

## 2022-03-15 DIAGNOSIS — Z86010 Personal history of colonic polyps: Secondary | ICD-10-CM | POA: Diagnosis not present

## 2022-03-21 ENCOUNTER — Other Ambulatory Visit: Payer: Self-pay | Admitting: Gastroenterology

## 2022-03-21 DIAGNOSIS — K8000 Calculus of gallbladder with acute cholecystitis without obstruction: Secondary | ICD-10-CM

## 2022-03-21 DIAGNOSIS — R1013 Epigastric pain: Secondary | ICD-10-CM

## 2022-03-29 ENCOUNTER — Ambulatory Visit
Admission: RE | Admit: 2022-03-29 | Discharge: 2022-03-29 | Disposition: A | Discharge status: Home / Self Care | Payer: Medicaid Other | Source: Ambulatory Visit | Attending: Gastroenterology | Admitting: Gastroenterology

## 2022-03-29 ENCOUNTER — Other Ambulatory Visit: Payer: Self-pay | Admitting: Gastroenterology

## 2022-03-29 DIAGNOSIS — R1013 Epigastric pain: Secondary | ICD-10-CM

## 2022-03-29 DIAGNOSIS — K8000 Calculus of gallbladder with acute cholecystitis without obstruction: Secondary | ICD-10-CM | POA: Insufficient documentation

## 2022-03-29 MED ORDER — TECHNETIUM TC 99M MEBROFENIN IV KIT
5.0000 | PACK | Freq: Once | INTRAVENOUS | Status: AC
  Administered 2022-03-29: 5.31 via INTRAVENOUS

## 2022-04-20 ENCOUNTER — Ambulatory Visit: Payer: Medicaid Other | Admitting: Surgery

## 2022-04-20 ENCOUNTER — Telehealth: Payer: Self-pay | Admitting: Surgery

## 2022-04-20 ENCOUNTER — Encounter: Payer: Self-pay | Admitting: Surgery

## 2022-04-20 ENCOUNTER — Other Ambulatory Visit: Payer: Self-pay

## 2022-04-20 VITALS — BP 117/72 | HR 64 | Temp 97.8°F | Ht 70.0 in | Wt 199.4 lb

## 2022-04-20 DIAGNOSIS — K8020 Calculus of gallbladder without cholecystitis without obstruction: Secondary | ICD-10-CM | POA: Diagnosis not present

## 2022-04-20 NOTE — Progress Notes (Signed)
04/20/2022  History of Present Illness: Phillip Finley is a 51 y.o. male presenting for follow up of cholelithiasis.  He was last seen on 01/05/22.  He had previously had a CT scan for his abdominal pain which showed cholelithiasis.  His stomach was also somewhat distended and had food debris in it so a gastric emptying study was also obtained which was normal.  On his last visit, he had mentioned that he felt his pain was related to his anxiety level, as he could tell that his anxiety had brought up some of his episodes.  He wanted to do watchful waiting to see how he can treat his anxiety and possibly his symptoms.  In the meantime, he has also seen GI and had an appointment with Dr. Alice Reichert (had previously also seen Dr. Bonna Gains).  A HIDA scan was ordered which showed a patent cystic duct and common bile duct.  No EF was obtained as the order was for only the HIDA scan.    The patient reports today that he continues having episodes of abdominal pain.  His pain is mostly epigastric.  Sometimes it is more constant, associated with nausea, but without a clear food trigger.  He has been researching online and he's also wondering about his pancreas as the culprit.  Past Medical History: Past Medical History:  Diagnosis Date   Anxiety    Barrett's esophagus    Depression    GERD (gastroesophageal reflux disease)      Past Surgical History: Past Surgical History:  Procedure Laterality Date   BONE MARROW ASPIRATION     COLONOSCOPY WITH PROPOFOL N/A 08/25/2021   Procedure: COLONOSCOPY WITH PROPOFOL;  Surgeon: Virgel Manifold, MD;  Location: [facility] ENDOSCOPY;  Service: Endoscopy;  Laterality: N/A;   ESOPHAGOGASTRODUODENOSCOPY N/A 08/25/2021   Procedure: ESOPHAGOGASTRODUODENOSCOPY (EGD);  Surgeon: Virgel Manifold, MD;  Location: [facility] ENDOSCOPY;  Service: Endoscopy;  Laterality: N/A;   nerve ablasion     In neck   NO PAST SURGERIES      Home Medications: Prior to Admission  medications   Medication Sig Start Date End Date Taking? Authorizing Provider  busPIRone (BUSPAR) 5 MG tablet Take 1 tablet (5 mg total) by mouth 2 (two) times daily as needed. 02/02/22  Yes Karamalegos, Devonne Doughty, DO    Allergies: No Known Allergies  Review of Systems: Review of Systems  Constitutional:  Negative for chills and fever.  HENT:  Negative for hearing loss.   Respiratory:  Negative for shortness of breath.   Cardiovascular:  Negative for chest pain.  Gastrointestinal:  Positive for abdominal pain and nausea. Negative for constipation, diarrhea and vomiting.  Genitourinary:  Negative for dysuria.  Musculoskeletal:  Negative for myalgias.  Skin:  Negative for rash.  Neurological:  Negative for dizziness.  Psychiatric/Behavioral:  Negative for depression.    Physical Exam BP 117/72   Pulse 64   Temp 97.8 F (36.6 C) (Oral)   Ht 5' 10"  (1.778 m)   Wt 199 lb 6.4 oz (90.4 kg)   SpO2 98%   BMI 28.61 kg/m  CONSTITUTIONAL: No acute distress, well nourished. HEENT:  Normocephalic, atraumatic, extraocular motion intact. NECK:  Trachea is midline, no jugular venous distention. RESPIRATORY:  Lungs are clear, and breath sounds are equal bilaterally. Normal respiratory effort without pathologic use of accessory muscles. CARDIOVASCULAR: Heart is regular without murmurs, gallops, or rubs. GI: The abdomen is soft, non-distended, with some discomfort in epigastric area and RUQ, though more in epigastric area.  Negative Murphy's sign. MUSCULOSKELETAL:  Normal gait, no peripheral edema NEUROLOGIC:  Motor and sensation is grossly normal.  Cranial nerves are grossly intact. PSYCH:  Alert and oriented to person, place and time. Affect is normal.  Labs/Imaging: Labs from 01/31/22: Na 144, K 4.3, Cl 109, CO2 29, BUN 10, Cr 1.04.  Total bili 0.4, AST 14, ALT 20, Alk Phos 64.  WBC 8.6, Hgb 16, Hct 46.9, Plt 223  HIDA scan 03/29/22: IMPRESSION: No scintigraphic evidence of acute  cholecystitis.  Gastric Emptying 12/31/21: IMPRESSION: Normal gastric emptying study.  CT abdomen/pelvis 12/10/21: IMPRESSION: 1. Cholelithiasis without wall thickening or pericholecystic fluid/stranding. 2. A tiny low-attenuation lesion in the right hepatic lobe is too small to characterize but almost certainly benign such as a small cyst or hemangioma. 3. Diffuse hepatic steatosis. 4. Mild atherosclerosis in the nonaneurysmal abdominal aorta.  Assessment and Plan: This is a 52 y.o. male with cholelithiasis.  --Discussed with the patient again the findings on his imaging studies.  Overall his symptoms, though perhaps atypical, seem to be more related to his gallbladder than other possible issues.  His anxiety appears to be well controlled at the time but his symptoms persist.  He is interested in surgical treatment. --Discussed with him the plan for a robotic assisted cholecystectomy and reviewed with him the surgery at length, including risks of bleeding, infection, injury to surrounding structures, the incisions, the use of ICG during surgery, post-op activity restrictions, that this is outpatient surgery, and he's willing to proceed. --Will schedule him for 05/06/22.  I spent 40 minutes dedicated to the care of this patient on the date of this encounter to include pre-visit review of records, face-to-face time with the patient discussing diagnosis and management, and any post-visit coordination of care.   Melvyn Neth, [facility]

## 2022-04-20 NOTE — Telephone Encounter (Signed)
Left message for patient to call, please inform him of the following regarding scheduled surgery:   Pre-Admission date/time, COVID Testing date and Surgery date.  Surgery Date: 05/06/22 Preadmission Testing Date: 05/03/22 (phone 8a-1p) Covid Testing Date: Not needed.    Also patient will need to call at [phone], between 1-3:00pm the day before surgery, to find out what time to arrive for surgery.

## 2022-04-20 NOTE — Patient Instructions (Signed)
Our surgery scheduler will call you within 24-48 hours to schedule your surgery. Please have the Blue surgery sheet available when speaking with her.     Minimally Invasive Cholecystectomy Minimally invasive cholecystectomy is surgery to remove the gallbladder. The gallbladder is a pear-shaped organ that lies beneath the liver on the right side of the body. The gallbladder stores bile, which is a fluid that helps the body digest fats. Cholecystectomy is often done to treat inflammation (irritation and swelling) of the gallbladder (cholecystitis). This condition is usually caused by a buildup of gallstones (cholelithiasis) in the gallbladder or when the fluid in the gall bladder becomes stagnant because gallstones get stuck in the ducts (tubes) and block the flow of bile. This can result in inflammation and pain. In severe cases, emergency surgery may be required. This procedure is done through small incisions in the abdomen, instead of one large incision. It is also called laparoscopic surgery. A thin scope with a camera (laparoscope) is inserted through one incision. Then surgical instruments are inserted through the other incisions. In some cases, a minimally invasive surgery may need to be changed to a surgery that is done through a larger incision. This is called open surgery. Tell a health care provider about: Any allergies you have. All medicines you are taking, including vitamins, herbs, eye drops, creams, and over-the-counter medicines. Any problems you or family members have had with anesthetic medicines. Any bleeding problems you have. Any surgeries you have had. Any medical conditions you have. Whether you are pregnant or may be pregnant. What are the risks? Generally, this is a safe procedure. However, problems may occur, including: Infection. Bleeding. Allergic reactions to medicines. Damage to nearby structures or organs. A gallstone remaining in the common bile duct. The common  bile duct carries bile from the gallbladder to the small intestine. A bile leak from the liver or cystic duct after your gallbladder is removed. What happens before the procedure? When to stop eating and drinking Follow instructions from your health care provider about what you may eat and drink before your procedure. These may include: 8 hours before the procedure Stop eating most foods. Do not eat meat, fried foods, or fatty foods. Eat only light foods, such as toast or crackers. All liquids are okay except energy drinks and alcohol. 6 hours before the procedure Stop eating. Drink only clear liquids, such as water, clear fruit juice, black coffee, plain tea, and sports drinks. Do not drink energy drinks or alcohol. 2 hours before the procedure Stop drinking all liquids. You may be allowed to take medicines with small sips of water. If you do not follow your health care provider's instructions, your procedure may be delayed or canceled. Medicines Ask your health care provider about: Changing or stopping your regular medicines. This is especially important if you are taking diabetes medicines or blood thinners. Taking medicines such as aspirin and ibuprofen. These medicines can thin your blood. Do not take these medicines unless your health care provider tells you to take them. Taking over-the-counter medicines, vitamins, herbs, and supplements. General instructions If you will be going home right after the procedure, plan to have a responsible adult: Take you home from the hospital or clinic. You will not be allowed to drive. Care for you for the time you are told. Do not use any products that contain nicotine or tobacco for at least 4 weeks before the procedure. These products include cigarettes, chewing tobacco, and vaping devices, such as e-cigarettes. If you   need help quitting, ask your health care provider. Ask your health care provider: How your surgery site will be marked. What  steps will be taken to help prevent infection. These may include: Removing hair at the surgery site. Washing skin with a germ-killing soap. Taking antibiotic medicine. What happens during the procedure?  An IV will be inserted into one of your veins. You will be given one or both of the following: A medicine to help you relax (sedative). A medicine to make you fall asleep (general anesthetic). Your surgeon will make several small incisions in your abdomen. The laparoscope will be inserted through one of the small incisions. The camera on the laparoscope will send images to a monitor in the operating room. This lets your surgeon see inside your abdomen. A gas will be pumped into your abdomen. This will expand your abdomen to give the surgeon more room to perform the surgery. Other tools that are needed for the procedure will be inserted through the other incisions. The gallbladder will be removed through one of the incisions. Your common bile duct may be examined. If stones are found in the common bile duct, they may be removed. After your gallbladder has been removed, the incisions will be closed with stitches (sutures), staples, or skin glue. Your incisions will be covered with a bandage (dressing). The procedure may vary among health care providers and hospitals. What happens after the procedure? Your blood pressure, heart rate, breathing rate, and blood oxygen level will be monitored until you leave the hospital or clinic. You will be given medicines as needed to control your pain. You may have a drain placed in the incision. The drain will be removed a day or two after the procedure. Summary Minimally invasive cholecystectomy, also called laparoscopic cholecystectomy, is surgery to remove the gallbladder using small incisions. Tell your health care provider about all the medical conditions you have and all the medicines you are taking for those conditions. Before the procedure, follow  instructions about when to stop eating and drinking and changing or stopping medicines. Plan to have a responsible adult care for you for the time you are told after you leave the hospital or clinic. This information is not intended to replace advice given to you by your health care provider. Make sure you discuss any questions you have with your health care provider. Document Revised: 05/11/2021 Document Reviewed: 05/11/2021 Elsevier Patient Education  2023 [employer] Inc.  

## 2022-04-20 NOTE — Telephone Encounter (Signed)
Patient calls back, he is now aware of all dates regarding surgery and verbalized understanding.

## 2022-05-03 ENCOUNTER — Encounter
Admission: RE | Admit: 2022-05-03 | Discharge: 2022-05-03 | Disposition: A | Discharge status: Home / Self Care | Payer: Medicaid Other | Source: Ambulatory Visit | Attending: Surgery | Admitting: Surgery

## 2022-05-03 ENCOUNTER — Other Ambulatory Visit: Payer: Self-pay

## 2022-05-03 HISTORY — DX: Dyspnea, unspecified: R06.00

## 2022-05-03 HISTORY — DX: Unspecified osteoarthritis, unspecified site: M19.90

## 2022-05-03 HISTORY — DX: Angina pectoris, unspecified: I20.9

## 2022-05-03 HISTORY — DX: Personal history of other diseases of the digestive system: Z87.19

## 2022-05-03 HISTORY — DX: Pneumonia, unspecified organism: J18.9

## 2022-05-03 NOTE — Patient Instructions (Addendum)
Your procedure is scheduled on:05/06/2022 Report to the Registration Desk on the 1st floor of the [facility]. To find out your arrival time, please call [phone] between 1PM - 3PM on: 05/05/2022  If your arrival time is 6:00 am, do not arrive prior to that time as the [facility] entrance doors do not open until 6:00 am.  REMEMBER: Instructions that are not followed completely may result in serious medical risk, up to and including death; or upon the discretion of your surgeon and anesthesiologist your surgery may need to be rescheduled.  Do not eat food after midnight the night before surgery.  No gum chewing, lozengers or hard candies.  You may however, drink CLEAR liquids up to 2 hours before you are scheduled to arrive for your surgery. Do not drink anything within 2 hours of your scheduled arrival time.  Clear liquids include: - water  - apple juice without pulp - gatorade (not RED colors) - black coffee or tea (Do NOT add milk or creamers to the coffee or tea) Do NOT drink anything that is not on this list.  TAKE THESE MEDICATIONS THE MORNING OF SURGERY WITH A SIP OF WATER: NONE  One week prior to surgery: Stop Anti-inflammatories (NSAIDS) such as Advil, Aleve, Ibuprofen, Motrin, Naproxen, Naprosyn and Aspirin based products such as Excedrin, Goodys Powder, BC Powder.  Stop ANY OVER THE COUNTER supplements until after surgery.  You may take Tylenol if needed for pain up until the day of surgery.  No Alcohol for 24 hours before or after surgery.  No Smoking including e-cigarettes for 24 hours prior to surgery.  No chewable tobacco products for at least 6 hours prior to surgery.  No nicotine patches on the day of surgery.  Do not use any "recreational" drugs for at least a week prior to your surgery.  Please be advised that the combination of cocaine and anesthesia may have negative outcomes, up to and including death. If you test positive for cocaine, your surgery  will be cancelled.  On the morning of surgery brush your teeth with toothpaste and water, you may rinse your mouth with mouthwash if you wish. Do not swallow any toothpaste or mouthwash.  Do not wear jewelry, make-up, hairpins, clips or nail polish.  Do not wear lotions, powders, or perfumes.   Do not shave body from the neck down 48 hours prior to surgery just in case you cut yourself which could leave a site for infection.  Also, freshly shaved skin may become irritated if using the CHG soap.  Contact lenses, hearing aids and dentures may not be worn into surgery.  Do not bring valuables to the hospital. [facility] is not responsible for any missing/lost belongings or valuables.   Notify your doctor if there is any change in your medical condition (cold, fever, infection).  Wear comfortable clothing (specific to your surgery type) to the hospital.  After surgery, you can help prevent lung complications by doing breathing exercises.  Take deep breaths and cough every 1-2 hours. Your doctor may order a device called an Incentive Spirometer to help you take deep breaths. When coughing or sneezing, hold a pillow firmly against your incision with both hands. This is called "splinting." Doing this helps protect your incision. It also decreases belly discomfort.  If you are being admitted to the hospital overnight, leave your suitcase in the car. After surgery it may be brought to your room.  If you are being discharged the day of  surgery, you will not be allowed to drive home. You will need a responsible adult (18 years or older) to drive you home and stay with you that night.   If you are taking public transportation, you will need to have a responsible adult (18 years or older) with you. Please confirm with your physician that it is acceptable to use public transportation.   Please call the [facility] Dept. at [phone] if you have any questions about these  instructions.  Surgery Visitation Policy:  Patients undergoing a surgery or procedure may have two family members or support persons with them as long as the person is not COVID-19 positive or experiencing its symptoms.   Inpatient Visitation:    Visiting hours are 7 a.m. to 8 p.m. Up to four visitors are allowed at one time in a patient room, including children. The visitors may rotate out with other people during the day. One designated support person (adult) may remain overnight.

## 2022-05-06 ENCOUNTER — Ambulatory Visit
Admission: RE | Admit: 2022-05-06 | Discharge: 2022-05-06 | Discharge status: Against Medical Advice | Payer: Medicaid Other | Attending: Surgery | Admitting: Surgery

## 2022-05-06 ENCOUNTER — Other Ambulatory Visit: Payer: Self-pay

## 2022-05-06 ENCOUNTER — Ambulatory Visit: Payer: Medicaid Other | Admitting: Anesthesiology

## 2022-05-06 ENCOUNTER — Encounter
Admission: RE | Discharge status: Against Medical Advice | Payer: Self-pay | Source: Home / Self Care | Attending: Surgery

## 2022-05-06 ENCOUNTER — Encounter: Payer: Self-pay | Admitting: Surgery

## 2022-05-06 DIAGNOSIS — K8020 Calculus of gallbladder without cholecystitis without obstruction: Secondary | ICD-10-CM | POA: Insufficient documentation

## 2022-05-06 DIAGNOSIS — Z5329 Procedure and treatment not carried out because of patient's decision for other reasons: Secondary | ICD-10-CM | POA: Diagnosis not present

## 2022-05-06 SURGERY — CHOLECYSTECTOMY, ROBOT-ASSISTED, LAPAROSCOPIC
Anesthesia: General

## 2022-05-06 MED ORDER — INDOCYANINE GREEN 25 MG IV SOLR
2.5000 mg | INTRAVENOUS | Status: AC
  Administered 2022-05-06: 2.5 mg via INTRAVENOUS
  Filled 2022-05-06: qty 1

## 2022-05-06 MED ORDER — GABAPENTIN 300 MG PO CAPS
ORAL_CAPSULE | ORAL | Status: AC
  Administered 2022-05-06: 300 mg via ORAL
  Filled 2022-05-06: qty 1

## 2022-05-06 MED ORDER — LACTATED RINGERS IV SOLN: INTRAVENOUS | Status: DC

## 2022-05-06 MED ORDER — GABAPENTIN 300 MG PO CAPS: 300.0000 mg | ORAL_CAPSULE | ORAL | Status: AC

## 2022-05-06 MED ORDER — MIDAZOLAM HCL 2 MG/2ML IJ SOLN
INTRAMUSCULAR | Status: AC
  Filled 2022-05-06: qty 2

## 2022-05-06 MED ORDER — FENTANYL CITRATE (PF) 100 MCG/2ML IJ SOLN
INTRAMUSCULAR | Status: AC
  Filled 2022-05-06: qty 2

## 2022-05-06 MED ORDER — PROPOFOL 10 MG/ML IV BOLUS
INTRAVENOUS | Status: AC
  Filled 2022-05-06: qty 20

## 2022-05-06 MED ORDER — CHLORHEXIDINE GLUCONATE CLOTH 2 % EX PADS
6.0000 | MEDICATED_PAD | Freq: Once | CUTANEOUS | Status: AC
  Administered 2022-05-06: 6 via TOPICAL

## 2022-05-06 MED ORDER — CHLORHEXIDINE GLUCONATE CLOTH 2 % EX PADS: 6.0000 | MEDICATED_PAD | Freq: Once | CUTANEOUS | Status: DC

## 2022-05-06 MED ORDER — CEFAZOLIN SODIUM-DEXTROSE 2-4 GM/100ML-% IV SOLN: 2.0000 g | INTRAVENOUS | Status: DC

## 2022-05-06 MED ORDER — CHLORHEXIDINE GLUCONATE 0.12 % MT SOLN
OROMUCOSAL | Status: AC
  Administered 2022-05-06: 15 mL
  Filled 2022-05-06: qty 15

## 2022-05-06 MED ORDER — ACETAMINOPHEN 500 MG PO TABS: 1000.0000 mg | ORAL_TABLET | ORAL | Status: AC

## 2022-05-06 MED ORDER — BUPIVACAINE LIPOSOME 1.3 % IJ SUSP: 20.0000 mL | Freq: Once | INTRAMUSCULAR | Status: DC

## 2022-05-06 MED ORDER — CEFAZOLIN SODIUM-DEXTROSE 2-4 GM/100ML-% IV SOLN
INTRAVENOUS | Status: AC
  Filled 2022-05-06: qty 100

## 2022-05-06 MED ORDER — ACETAMINOPHEN 500 MG PO TABS
ORAL_TABLET | ORAL | Status: AC
  Administered 2022-05-06: 1000 mg via ORAL
  Filled 2022-05-06: qty 2

## 2022-05-06 SURGICAL SUPPLY — 53 items
BAG PRESSURE INF REUSE 1000 (BAG) IMPLANT
BAG RETRIEVAL 10 (BASKET) ×1
CANNULA REDUC XI 12-8 STAPL (CANNULA) ×1
CANNULA REDUCER 12-8 DVNC XI (CANNULA) ×1 IMPLANT
CLIP LIGATING HEMO O LOK GREEN (MISCELLANEOUS) ×2 IMPLANT
CUP MEDICINE 2OZ PLAST GRAD ST (MISCELLANEOUS) ×2 IMPLANT
DERMABOND ADVANCED (GAUZE/BANDAGES/DRESSINGS) ×1
DERMABOND ADVANCED .7 DNX12 (GAUZE/BANDAGES/DRESSINGS) ×1 IMPLANT
DRAPE ARM DVNC X/XI (DISPOSABLE) ×4 IMPLANT
DRAPE COLUMN DVNC XI (DISPOSABLE) ×1 IMPLANT
DRAPE DA VINCI XI ARM (DISPOSABLE) ×4
DRAPE DA VINCI XI COLUMN (DISPOSABLE) ×1
ELECT CAUTERY BLADE TIP 2.5 (TIP) ×2
ELECT REM PT RETURN 9FT ADLT (ELECTROSURGICAL) ×2
ELECTRODE CAUTERY BLDE TIP 2.5 (TIP) ×1 IMPLANT
ELECTRODE REM PT RTRN 9FT ADLT (ELECTROSURGICAL) ×1 IMPLANT
GLOVE SURG SYN 7.0 (GLOVE) ×4 IMPLANT
GLOVE SURG SYN 7.5  E (GLOVE) ×2
GLOVE SURG SYN 7.5 E (GLOVE) ×2 IMPLANT
GOWN STRL REUS W/ TWL LRG LVL3 (GOWN DISPOSABLE) ×4 IMPLANT
GOWN STRL REUS W/TWL LRG LVL3 (GOWN DISPOSABLE) ×4
IRRIGATOR SUCT 8 DISP DVNC XI (IRRIGATION / IRRIGATOR) IMPLANT
IRRIGATOR SUCTION 8MM XI DISP (IRRIGATION / IRRIGATOR)
IV NS 1000ML (IV SOLUTION)
IV NS 1000ML BAXH (IV SOLUTION) IMPLANT
KIT PINK PAD W/HEAD ARE REST (MISCELLANEOUS) ×2
KIT PINK PAD W/HEAD ARM REST (MISCELLANEOUS) ×1 IMPLANT
LABEL OR SOLS (LABEL) ×2 IMPLANT
MANIFOLD NEPTUNE II (INSTRUMENTS) ×2 IMPLANT
NEEDLE HYPO 22GX1.5 SAFETY (NEEDLE) ×2 IMPLANT
NS IRRIG 500ML POUR BTL (IV SOLUTION) ×2 IMPLANT
OBTURATOR OPTICAL STANDARD 8MM (TROCAR) ×1
OBTURATOR OPTICAL STND 8 DVNC (TROCAR) ×1
OBTURATOR OPTICALSTD 8 DVNC (TROCAR) ×1 IMPLANT
PACK LAP CHOLECYSTECTOMY (MISCELLANEOUS) ×2 IMPLANT
PENCIL ELECTRO HAND CTR (MISCELLANEOUS) ×2 IMPLANT
SEAL CANN UNIV 5-8 DVNC XI (MISCELLANEOUS) ×3 IMPLANT
SEAL XI 5MM-8MM UNIVERSAL (MISCELLANEOUS) ×3
SET TUBE SMOKE EVAC HIGH FLOW (TUBING) ×2 IMPLANT
SOLUTION ELECTROLUBE (MISCELLANEOUS) ×2 IMPLANT
SPIKE FLUID TRANSFER (MISCELLANEOUS) ×2 IMPLANT
SPONGE T-LAP 18X18 ~~LOC~~+RFID (SPONGE) IMPLANT
SPONGE T-LAP 4X18 ~~LOC~~+RFID (SPONGE) ×2 IMPLANT
STAPLER CANNULA SEAL DVNC XI (STAPLE) ×1 IMPLANT
STAPLER CANNULA SEAL XI (STAPLE) ×1
SUT MNCRL AB 4-0 PS2 18 (SUTURE) ×2 IMPLANT
SUT VIC AB 3-0 SH 27 (SUTURE)
SUT VIC AB 3-0 SH 27X BRD (SUTURE) IMPLANT
SUT VICRYL 0 AB UR-6 (SUTURE) ×4 IMPLANT
SYS BAG RETRIEVAL 10MM (BASKET) ×1
SYSTEM BAG RETRIEVAL 10MM (BASKET) ×1 IMPLANT
TAPE TRANSPORE STRL 2 31045 (GAUZE/BANDAGES/DRESSINGS) ×2 IMPLANT
WATER STERILE IRR 500ML POUR (IV SOLUTION) ×2 IMPLANT

## 2022-05-06 NOTE — Anesthesia Preprocedure Evaluation (Signed)
Anesthesia Evaluation  Patient identified by MRN, date of birth, ID band Patient awake    Reviewed: Allergy & Precautions, NPO status , Patient's Chart, lab work & pertinent test results  Airway Mallampati: II  TM Distance: >3 FB Neck ROM: Limited    Dental  (+) Edentulous Upper, Dental Advisory Given   Pulmonary neg pulmonary ROS, shortness of breath, Current Smoker and Patient abstained from smoking.,    Pulmonary exam normal  + decreased breath sounds      Cardiovascular negative cardio ROS Normal cardiovascular exam Rhythm:Regular  Negative stress test, ECG wnl   Neuro/Psych Anxiety Depression negative neurological ROS  negative psych ROS   GI/Hepatic negative GI ROS, Neg liver ROS, GERD  ,  Endo/Other  negative endocrine ROS  Renal/GU negative Renal ROS     Musculoskeletal   Abdominal (+) + obese,   Peds negative pediatric ROS (+)  Hematology negative hematology ROS (+)   Anesthesia Other Findings Past Medical History: No date: Anginal pain (HCC)     Comment:  acid reflux No date: Anxiety No date: Arthritis No date: Barrett's esophagus No date: Depression No date: Dyspnea No date: GERD (gastroesophageal reflux disease) No date: History of hiatal hernia No date: Pneumonia  Past Surgical History: No date: BONE MARROW ASPIRATION 08/25/2021: COLONOSCOPY WITH PROPOFOL; N/A     Comment:  Procedure: COLONOSCOPY WITH PROPOFOL;  Surgeon:               Virgel Manifold, MD;  Location: [facility] ENDOSCOPY;                Service: Endoscopy;  Laterality: N/A; 08/25/2021: ESOPHAGOGASTRODUODENOSCOPY; N/A     Comment:  Procedure: ESOPHAGOGASTRODUODENOSCOPY (EGD);  Surgeon:               Virgel Manifold, MD;  Location: [facility] ENDOSCOPY;                Service: Endoscopy;  Laterality: N/A; No date: nerve ablasion     Comment:  In neck No date: NO PAST SURGERIES  BMI    Body Mass Index: 28.12 kg/m       Reproductive/Obstetrics negative OB ROS                             Anesthesia Physical Anesthesia Plan  ASA: 3  Anesthesia Plan: General   Post-op Pain Management:    Induction: Intravenous  PONV Risk Score and Plan: Ondansetron, Dexamethasone, Midazolam and Treatment may vary due to age or medical condition  Airway Management Planned: Oral ETT  Additional Equipment:   Intra-op Plan:   Post-operative Plan:   Informed Consent: I have reviewed the patients History and Physical, chart, labs and discussed the procedure including the risks, benefits and alternatives for the proposed anesthesia with the patient or authorized representative who has indicated his/her understanding and acceptance.     Dental Advisory Given  Plan Discussed with: Anesthesiologist, CRNA and Surgeon  Anesthesia Plan Comments:         Anesthesia Quick Evaluation

## 2022-05-06 NOTE — Progress Notes (Signed)
Patient stated he did not want to wait any longer for surgery. Patient informed Dr. Hampton Abbot would be finishing in approximately 15 minutes. Patient stated could not wait that long and proceeded to get dressed.  Patient requested IV to be taken out. IV was removed and patient informed on rescheduling process.

## 2022-05-26 ENCOUNTER — Telehealth: Payer: Self-pay | Admitting: Surgery

## 2022-05-26 NOTE — Telephone Encounter (Signed)
Outgoing call is made, left message for patient to call regarding updated rescheduled surgery at his request. Please inform patient of the following regarding scheduled surgery:   Pre-Admission date/time, and Surgery date.  Surgery Date: 06/07/22 Preadmission Testing Date: 05/31/22 (phone 8a-1p)  Also patient will  need to call at [phone], between 1-3:00pm the day before surgery, to find out what time to arrive for surgery.

## 2022-05-26 NOTE — Telephone Encounter (Signed)
Patient calls back, he is now aware of all dates regarding his rescheduled surgery and verbalized understanding.

## 2022-05-30 ENCOUNTER — Ambulatory Visit: Payer: Medicaid Other | Admitting: Surgery

## 2022-05-30 ENCOUNTER — Encounter: Payer: Self-pay | Admitting: Surgery

## 2022-05-30 VITALS — BP 132/75 | HR 60 | Temp 98.0°F | Wt 197.0 lb

## 2022-05-30 DIAGNOSIS — R1013 Epigastric pain: Secondary | ICD-10-CM

## 2022-05-30 DIAGNOSIS — K8020 Calculus of gallbladder without cholecystitis without obstruction: Secondary | ICD-10-CM | POA: Diagnosis not present

## 2022-05-30 NOTE — Progress Notes (Signed)
  05/30/2022  History of Present Illness: Phillip Finley is a 51 y.o. male presenting for H&P update for a robotic assisted cholecystectomy.  The patient was initially scheduled for 05/06/22 but due to delays and patient nervousness, he walked out of the preop area.  He presents today because he wanted to reschedule surgery.  He's currently scheduled for 06/07/22.  Patient reports that overall his symptoms are stable, with some epigastric and RUQ discomfort, but has not had any episodes of severe pain.  Denies any fevers, chills, chest pain, shortness of breath.  Past Medical History: Past Medical History:  Diagnosis Date   Anginal pain (HCC)    acid reflux   Anxiety    Arthritis    Barrett's esophagus    Depression    Dyspnea    GERD (gastroesophageal reflux disease)    History of hiatal hernia    Pneumonia      Past Surgical History: Past Surgical History:  Procedure Laterality Date   BONE MARROW ASPIRATION     COLONOSCOPY WITH PROPOFOL N/A 08/25/2021   Procedure: COLONOSCOPY WITH PROPOFOL;  Surgeon: Virgel Manifold, MD;  Location: [facility] ENDOSCOPY;  Service: Endoscopy;  Laterality: N/A;   ESOPHAGOGASTRODUODENOSCOPY N/A 08/25/2021   Procedure: ESOPHAGOGASTRODUODENOSCOPY (EGD);  Surgeon: Virgel Manifold, MD;  Location: [facility] ENDOSCOPY;  Service: Endoscopy;  Laterality: N/A;   nerve ablasion     In neck   NO PAST SURGERIES      Home Medications: Prior to Admission medications   Not on File    Allergies: No Known Allergies  Review of Systems: Review of Systems  Constitutional:  Negative for chills and fever.  Respiratory:  Negative for shortness of breath.   Cardiovascular:  Negative for chest pain.  Gastrointestinal:  Positive for abdominal pain. Negative for nausea and vomiting.  Skin:  Negative for rash.    Physical Exam BP 132/75   Pulse 60   Temp 98 F (36.7 C) (Oral)   Wt 197 lb (89.4 kg)   SpO2 98%   BMI 28.27 kg/m  CONSTITUTIONAL: No acute  distress, well nourished. HEENT:  Normocephalic, atraumatic, extraocular motion intact. RESPIRATORY:  Lungs are clear, and breath sounds are equal bilaterally. Normal respiratory effort without pathologic use of accessory muscles. CARDIOVASCULAR: Heart is regular without murmurs, gallops, or rubs. GI: The abdomen is soft, non-distended, with some discomfort in epigastric and RUQ areas.  Negative Murphy's sign.  Patient has mild diastasis recti. NEUROLOGIC:  Motor and sensation is grossly normal.  Cranial nerves are grossly intact. PSYCH:  Alert and oriented to person, place and time. Affect is normal.   Assessment and Plan: This is a 51 y.o. male with cholelithiasis and abdominal pain.  --Patient is scheduled for robotic assisted cholecystectomy on 06/07/22.  His will be first case of the day so there are no delays prior to his surgery.  Discussed with him the surgery plan again, including incisions, surgical risks, that this is an outpatient surgery, post-op activity restrictions and recovery.  He's willing to proceed. --All his questions have been answered.  I spent 25 minutes dedicated to the care of this patient on the date of this encounter to include pre-visit review of records, face-to-face time with the patient discussing diagnosis and management, and any post-visit coordination of care.   Melvyn Neth, [facility]

## 2022-05-30 NOTE — Patient Instructions (Addendum)
If you have any concerns or questions, please feel free to call our office.    Minimally Invasive Cholecystectomy Minimally invasive cholecystectomy is surgery to remove the gallbladder. The gallbladder is a pear-shaped organ that lies beneath the liver on the right side of the body. The gallbladder stores bile, which is a fluid that helps the body digest fats. Cholecystectomy is often done to treat inflammation (irritation and swelling) of the gallbladder (cholecystitis). This condition is usually caused by a buildup of gallstones (cholelithiasis) in the gallbladder or when the fluid in the gall bladder becomes stagnant because gallstones get stuck in the ducts (tubes) and block the flow of bile. This can result in inflammation and pain. In severe cases, emergency surgery may be required. This procedure is done through small incisions in the abdomen, instead of one large incision. It is also called laparoscopic surgery. A thin scope with a camera (laparoscope) is inserted through one incision. Then surgical instruments are inserted through the other incisions. In some cases, a minimally invasive surgery may need to be changed to a surgery that is done through a larger incision. This is called open surgery. Tell a health care provider about: Any allergies you have. All medicines you are taking, including vitamins, herbs, eye drops, creams, and over-the-counter medicines. Any problems you or family members have had with anesthetic medicines. Any bleeding problems you have. Any surgeries you have had. Any medical conditions you have. Whether you are pregnant or may be pregnant. What are the risks? Generally, this is a safe procedure. However, problems may occur, including: Infection. Bleeding. Allergic reactions to medicines. Damage to nearby structures or organs. A gallstone remaining in the common bile duct. The common bile duct carries bile from the gallbladder to the small intestine. A bile  leak from the liver or cystic duct after your gallbladder is removed. What happens before the procedure? When to stop eating and drinking Follow instructions from your health care provider about what you may eat and drink before your procedure. These may include: 8 hours before the procedure Stop eating most foods. Do not eat meat, fried foods, or fatty foods. Eat only light foods, such as toast or crackers. All liquids are okay except energy drinks and alcohol. 6 hours before the procedure Stop eating. Drink only clear liquids, such as water, clear fruit juice, black coffee, plain tea, and sports drinks. Do not drink energy drinks or alcohol. 2 hours before the procedure Stop drinking all liquids. You may be allowed to take medicines with small sips of water. If you do not follow your health care provider's instructions, your procedure may be delayed or canceled. Medicines Ask your health care provider about: Changing or stopping your regular medicines. This is especially important if you are taking diabetes medicines or blood thinners. Taking medicines such as aspirin and ibuprofen. These medicines can thin your blood. Do not take these medicines unless your health care provider tells you to take them. Taking over-the-counter medicines, vitamins, herbs, and supplements. General instructions If you will be going home right after the procedure, plan to have a responsible adult: Take you home from the hospital or clinic. You will not be allowed to drive. Care for you for the time you are told. Do not use any products that contain nicotine or tobacco for at least 4 weeks before the procedure. These products include cigarettes, chewing tobacco, and vaping devices, such as e-cigarettes. If you need help quitting, ask your health care provider. Ask your health  care provider: How your surgery site will be marked. What steps will be taken to help prevent infection. These may include: Removing  hair at the surgery site. Washing skin with a germ-killing soap. Taking antibiotic medicine. What happens during the procedure?  An IV will be inserted into one of your veins. You will be given one or both of the following: A medicine to help you relax (sedative). A medicine to make you fall asleep (general anesthetic). Your surgeon will make several small incisions in your abdomen. The laparoscope will be inserted through one of the small incisions. The camera on the laparoscope will send images to a monitor in the operating room. This lets your surgeon see inside your abdomen. A gas will be pumped into your abdomen. This will expand your abdomen to give the surgeon more room to perform the surgery. Other tools that are needed for the procedure will be inserted through the other incisions. The gallbladder will be removed through one of the incisions. Your common bile duct may be examined. If stones are found in the common bile duct, they may be removed. After your gallbladder has been removed, the incisions will be closed with stitches (sutures), staples, or skin glue. Your incisions will be covered with a bandage (dressing). The procedure may vary among health care providers and hospitals. What happens after the procedure? Your blood pressure, heart rate, breathing rate, and blood oxygen level will be monitored until you leave the hospital or clinic. You will be given medicines as needed to control your pain. You may have a drain placed in the incision. The drain will be removed a day or two after the procedure. Summary Minimally invasive cholecystectomy, also called laparoscopic cholecystectomy, is surgery to remove the gallbladder using small incisions. Tell your health care provider about all the medical conditions you have and all the medicines you are taking for those conditions. Before the procedure, follow instructions about when to stop eating and drinking and changing or stopping  medicines. Plan to have a responsible adult care for you for the time you are told after you leave the hospital or clinic. This information is not intended to replace advice given to you by your health care provider. Make sure you discuss any questions you have with your health care provider.

## 2022-05-31 ENCOUNTER — Inpatient Hospital Stay: Admission: RE | Admit: 2022-05-31 | Payer: Medicaid Other | Source: Ambulatory Visit

## 2022-05-31 HISTORY — DX: Hyperlipidemia, unspecified: E78.5

## 2022-06-06 ENCOUNTER — Inpatient Hospital Stay: Admission: RE | Admit: 2022-06-06 | Payer: Medicaid Other | Source: Ambulatory Visit

## 2022-06-06 ENCOUNTER — Telehealth: Payer: Self-pay | Admitting: Surgery

## 2022-06-06 NOTE — Telephone Encounter (Signed)
Patient called to cancel surgery for 06/07/22.  He has cancelled surgery before. He states has too many personal problems going on in his life right now.

## 2022-06-07 ENCOUNTER — Ambulatory Visit: Admission: RE | Admit: 2022-06-07 | Payer: Medicaid Other | Source: Home / Self Care | Admitting: Surgery

## 2022-06-07 ENCOUNTER — Encounter: Admission: RE | Payer: Self-pay | Source: Home / Self Care

## 2022-06-07 SURGERY — CHOLECYSTECTOMY, ROBOT-ASSISTED, LAPAROSCOPIC
Anesthesia: General

## 2022-06-14 ENCOUNTER — Ambulatory Visit: Payer: Self-pay | Admitting: General Surgery

## 2022-06-14 DIAGNOSIS — K8020 Calculus of gallbladder without cholecystitis without obstruction: Secondary | ICD-10-CM | POA: Diagnosis not present

## 2022-06-14 NOTE — H&P (Signed)
PATIENT PROFILE: Phillip Finley is a 51 y.o. male who presents to the Clinic for consultation at the request of Dr. Chauncey Mann for evaluation of cholelithiasis.  PCP:  Olin Hauser, DO  HISTORY OF PRESENT ILLNESS: Phillip Finley reports has been dealing with epigastric pain for at least about a year.  He endorses that the pain is in the epigastric area and radiates to his back.  He cannot identify any specific alleviating or aggravating factors.  Sometimes it can be aggravated by food but sometimes he cannot even eat fatty food and still has the epigastric pain.  He has been evaluated by GI and surgery in the past.  He has had abdominal ultrasound that shows cholelithiasis without cholecystitis.  He has also had a HIDA scan shows normal ejection fraction.  He has gastric emptying starting with normal gastric function.  I have personally evaluated his images before.  Something the pain is so intense that he has to go to the emergency room for further evaluation but then the pain subsided.  He denies any fever or chills.   PROBLEM LIST: Problem List  Date Reviewed: 08/01/2016          Noted   Anxiety and depression 11/02/2015   Overview    Last Assessment & Plan:  Took pt out of work 2/2 anxiety and chest pain. Stop lexapro 2/2 side effects. Will trial venlafaxine once daily to help with symptoms. Add short-term ativan 1/2 tablet to help with severe anxiety symptoms. Recheck 3 weeks to determine if medication will work and release back to work.  Consider counseling or psychiatry if symptoms are refractory.       Barrett's esophagus 11/02/2015   Overview    Last Assessment & Plan:  Pt not taking PPI at this time. Refer to GI for EGD to monitor and determine if chronic PPI therapy needed. No acid symptoms at this time. Will check CBC today.        GENERAL REVIEW OF SYSTEMS:   General ROS: negative for - chills, fatigue, fever, weight gain or weight loss Allergy and Immunology ROS:  negative for - hives  Hematological and Lymphatic ROS: negative for - bleeding problems or bruising, negative for palpable nodes Endocrine ROS: negative for - heat or cold intolerance, hair changes Respiratory ROS: negative for - cough, shortness of breath or wheezing Cardiovascular ROS: no chest pain or palpitations GI ROS: negative for nausea, vomiting, diarrhea, constipation.  Positive for abdominal pain Musculoskeletal ROS: negative for - joint swelling or muscle pain Neurological ROS: negative for - confusion, syncope Dermatological ROS: negative for pruritus and rash Psychiatric: negative for anxiety, depression, difficulty sleeping and memory loss  MEDICATIONS: Current Outpatient Medications  Medication Sig Dispense Refill   pantoprazole (PROTONIX) 40 MG DR tablet Take 1 tablet (40 mg total) by mouth once daily Take 15-20 mins before meal 30 tablet 3   venlafaxine 37.5 mg ER tablet Take by mouth.     No current facility-administered medications for this visit.    ALLERGIES: Patient has no known allergies.  PAST MEDICAL HISTORY: Past Medical History:  Diagnosis Date   Anxiety    Depression    GERD (gastroesophageal reflux disease)     PAST SURGICAL HISTORY: Past Surgical History:  Procedure Laterality Date   UPPER GASTROINTESTINAL ENDOSCOPY       FAMILY HISTORY: Family History  Problem Relation Age of Onset   Heart disease Father    Colon cancer Neg Hx    Colon polyps  Neg Hx    Rectal cancer Neg Hx    Liver disease Neg Hx    Ulcers Neg Hx      SOCIAL HISTORY: Social History   Socioeconomic History   Marital status: Married  Tobacco Use   Smoking status: Every Day    Packs/day: 1.00    Years: 30.00    Pack years: 30.00    Types: Cigarettes   Smokeless tobacco: Never  Substance and Sexual Activity   Alcohol use: No   Drug use: No    PHYSICAL EXAM: Vitals:   06/14/22 1109  BP: 114/72  Pulse: 68   Body mass index is 28.84 kg/m. Weight: 91.2  kg (201 lb)   GENERAL: Alert, active, oriented x3  HEENT: Pupils equal reactive to light. Extraocular movements are intact. Sclera clear. Palpebral conjunctiva normal red color.Pharynx clear.  NECK: Supple with no palpable mass and no adenopathy.  LUNGS: Sound clear with no rales rhonchi or wheezes.  HEART: Regular rhythm S1 and S2 without murmur.  ABDOMEN: Soft and depressible, nontender with no palpable mass, no hepatomegaly.   EXTREMITIES: Well-developed well-nourished symmetrical with no dependent edema.  NEUROLOGICAL: Awake alert oriented, facial expression symmetrical, moving all extremities.  REVIEW OF DATA: I have reviewed the following data today: Initial consult on 06/14/2022  Component Date Value   Glucose 06/14/2022 99    Sodium 06/14/2022 139    Potassium 06/14/2022 4.3    Chloride 06/14/2022 107    Carbon Dioxide (CO2) 06/14/2022 29.4    Urea Nitrogen (BUN) 06/14/2022 8    Creatinine 06/14/2022 1.1    Glomerular Filtration Ra* 06/14/2022 71    Calcium 06/14/2022 9.5    AST  06/14/2022 12    ALT  06/14/2022 13    Alk Phos (alkaline Phosp* 06/14/2022 69    Albumin 06/14/2022 4.6    Bilirubin, Total 06/14/2022 0.4    Protein, Total 06/14/2022 6.8    A/G Ratio 06/14/2022 2.1    WBC (White Blood Cell Co* 06/14/2022 7.5    RBC (Red Blood Cell Coun* 06/14/2022 4.71    Hemoglobin 06/14/2022 15.6    Hematocrit 06/14/2022 44.7    MCV (Mean Corpuscular Vo* 06/14/2022 94.9    MCH (Mean Corpuscular He* 06/14/2022 33.1 (H)    MCHC (Mean Corpuscular H* 06/14/2022 34.9    Platelet Count 06/14/2022 200    RDW-CV (Red Cell Distrib* 06/14/2022 12.1    MPV (Mean Platelet Volum* 06/14/2022 9.7    Neutrophils 06/14/2022 3.27    Lymphocytes 06/14/2022 3.23    Monocytes 06/14/2022 0.63    Eosinophils 06/14/2022 0.26    Basophils 06/14/2022 0.09    Neutrophil % 06/14/2022 43.7    Lymphocyte % 06/14/2022 43.1    Monocyte % 06/14/2022 8.4    Eosinophil % 06/14/2022 3.5     Basophil% 06/14/2022 1.2    Immature Granulocyte % 06/14/2022 0.1    Immature Granulocyte Cou* 06/14/2022 0.01    Lipase 06/14/2022 22      ASSESSMENT: Phillip Finley is a 51 y.o. male presenting for consultation for cholelithiasis.    Patient was oriented about the diagnosis of cholelithiasis. Also oriented about what is the gallbladder, its anatomy and function and the implications of having stones. The patient was oriented about the treatment alternatives (observation vs cholecystectomy). Patient was oriented that a low percentage of patient will continue to have similar pain symptoms even after the gallbladder is removed. Surgical technique (open vs laparoscopic) was discussed. It was also discussed the goals  of the surgery (decrease the pain episodes and avoid the risk of cholecystitis) and the risk of surgery including: bleeding, infection, common bile duct injury, stone retention, injury to other organs such as bowel, liver, stomach, other complications such as hernia, bowel obstruction among others. Also discussed with patient about anesthesia and its complications such as: reaction to medications, pneumonia, heart complications, death, among others.   Labs today shows normal bilirubin level and normal lipase.  No sign of bile duct or pancreatic obstruction.  Cholelithiasis without cholecystitis [K80.20]  PLAN: 1.  Robotic assisted laparoscopic cholecystectomy (82800) 2.  CBC, CMP, lipase 3.  Do not take aspirin 5 days before the procedure 4.  Contact us if has any question or concern.   Patient verbalized understanding, all questions were answered, and were agreeable with the plan outlined above.   Herbert Pun, MD  Electronically signed by Herbert Pun, MD

## 2022-06-14 NOTE — H&P (View-Only) (Signed)
PATIENT PROFILE: Phillip Finley is a 50 y.o. male who presents to the Clinic for consultation at the request of Dr. Chauncey Mann for evaluation of cholelithiasis.  PCP:  Olin Hauser, DO  HISTORY OF PRESENT ILLNESS: Phillip Finley reports has been dealing with epigastric pain for at least about a year.  He endorses that the pain is in the epigastric area and radiates to his back.  He cannot identify any specific alleviating or aggravating factors.  Sometimes it can be aggravated by food but sometimes he cannot even eat fatty food and still has the epigastric pain.  He has been evaluated by GI and surgery in the past.  He has had abdominal ultrasound that shows cholelithiasis without cholecystitis.  He has also had a HIDA scan shows normal ejection fraction.  He has gastric emptying starting with normal gastric function.  I have personally evaluated his images before.  Something the pain is so intense that he has to go to the emergency room for further evaluation but then the pain subsided.  He denies any fever or chills.   PROBLEM LIST: Problem List  Date Reviewed: 08/01/2016          Noted   Anxiety and depression 11/02/2015   Overview    Last Assessment & Plan:  Took pt out of work 2/2 anxiety and chest pain. Stop lexapro 2/2 side effects. Will trial venlafaxine once daily to help with symptoms. Add short-term ativan 1/2 tablet to help with severe anxiety symptoms. Recheck 3 weeks to determine if medication will work and release back to work.  Consider counseling or psychiatry if symptoms are refractory.       Barrett's esophagus 11/02/2015   Overview    Last Assessment & Plan:  Pt not taking PPI at this time. Refer to GI for EGD to monitor and determine if chronic PPI therapy needed. No acid symptoms at this time. Will check CBC today.        GENERAL REVIEW OF SYSTEMS:   General ROS: negative for - chills, fatigue, fever, weight gain or weight loss Allergy and Immunology ROS:  negative for - hives  Hematological and Lymphatic ROS: negative for - bleeding problems or bruising, negative for palpable nodes Endocrine ROS: negative for - heat or cold intolerance, hair changes Respiratory ROS: negative for - cough, shortness of breath or wheezing Cardiovascular ROS: no chest pain or palpitations GI ROS: negative for nausea, vomiting, diarrhea, constipation.  Positive for abdominal pain Musculoskeletal ROS: negative for - joint swelling or muscle pain Neurological ROS: negative for - confusion, syncope Dermatological ROS: negative for pruritus and rash Psychiatric: negative for anxiety, depression, difficulty sleeping and memory loss  MEDICATIONS: Current Outpatient Medications  Medication Sig Dispense Refill   pantoprazole (PROTONIX) 40 MG DR tablet Take 1 tablet (40 mg total) by mouth once daily Take 15-20 mins before meal 30 tablet 3   venlafaxine 37.5 mg ER tablet Take by mouth.     No current facility-administered medications for this visit.    ALLERGIES: Patient has no known allergies.  PAST MEDICAL HISTORY: Past Medical History:  Diagnosis Date   Anxiety    Depression    GERD (gastroesophageal reflux disease)     PAST SURGICAL HISTORY: Past Surgical History:  Procedure Laterality Date   UPPER GASTROINTESTINAL ENDOSCOPY       FAMILY HISTORY: Family History  Problem Relation Age of Onset   Heart disease Father    Colon cancer Neg Hx    Colon polyps  Neg Hx    Rectal cancer Neg Hx    Liver disease Neg Hx    Ulcers Neg Hx      SOCIAL HISTORY: Social History   Socioeconomic History   Marital status: Married  Tobacco Use   Smoking status: Every Day    Packs/day: 1.00    Years: 30.00    Pack years: 30.00    Types: Cigarettes   Smokeless tobacco: Never  Substance and Sexual Activity   Alcohol use: No   Drug use: No    PHYSICAL EXAM: Vitals:   06/14/22 1109  BP: 114/72  Pulse: 68   Body mass index is 28.84 kg/m. Weight: 91.2  kg (201 lb)   GENERAL: Alert, active, oriented x3  HEENT: Pupils equal reactive to light. Extraocular movements are intact. Sclera clear. Palpebral conjunctiva normal red color.Pharynx clear.  NECK: Supple with no palpable mass and no adenopathy.  LUNGS: Sound clear with no rales rhonchi or wheezes.  HEART: Regular rhythm S1 and S2 without murmur.  ABDOMEN: Soft and depressible, nontender with no palpable mass, no hepatomegaly.   EXTREMITIES: Well-developed well-nourished symmetrical with no dependent edema.  NEUROLOGICAL: Awake alert oriented, facial expression symmetrical, moving all extremities.  REVIEW OF DATA: I have reviewed the following data today: Initial consult on 06/14/2022  Component Date Value   Glucose 06/14/2022 99    Sodium 06/14/2022 139    Potassium 06/14/2022 4.3    Chloride 06/14/2022 107    Carbon Dioxide (CO2) 06/14/2022 29.4    Urea Nitrogen (BUN) 06/14/2022 8    Creatinine 06/14/2022 1.1    Glomerular Filtration Ra* 06/14/2022 71    Calcium 06/14/2022 9.5    AST  06/14/2022 12    ALT  06/14/2022 13    Alk Phos (alkaline Phosp* 06/14/2022 69    Albumin 06/14/2022 4.6    Bilirubin, Total 06/14/2022 0.4    Protein, Total 06/14/2022 6.8    A/G Ratio 06/14/2022 2.1    WBC (White Blood Cell Co* 06/14/2022 7.5    RBC (Red Blood Cell Coun* 06/14/2022 4.71    Hemoglobin 06/14/2022 15.6    Hematocrit 06/14/2022 44.7    MCV (Mean Corpuscular Vo* 06/14/2022 94.9    MCH (Mean Corpuscular He* 06/14/2022 33.1 (H)    MCHC (Mean Corpuscular H* 06/14/2022 34.9    Platelet Count 06/14/2022 200    RDW-CV (Red Cell Distrib* 06/14/2022 12.1    MPV (Mean Platelet Volum* 06/14/2022 9.7    Neutrophils 06/14/2022 3.27    Lymphocytes 06/14/2022 3.23    Monocytes 06/14/2022 0.63    Eosinophils 06/14/2022 0.26    Basophils 06/14/2022 0.09    Neutrophil % 06/14/2022 43.7    Lymphocyte % 06/14/2022 43.1    Monocyte % 06/14/2022 8.4    Eosinophil % 06/14/2022 3.5     Basophil% 06/14/2022 1.2    Immature Granulocyte % 06/14/2022 0.1    Immature Granulocyte Cou* 06/14/2022 0.01    Lipase 06/14/2022 22      ASSESSMENT: Phillip Finley is a 51 y.o. male presenting for consultation for cholelithiasis.    Patient was oriented about the diagnosis of cholelithiasis. Also oriented about what is the gallbladder, its anatomy and function and the implications of having stones. The patient was oriented about the treatment alternatives (observation vs cholecystectomy). Patient was oriented that a low percentage of patient will continue to have similar pain symptoms even after the gallbladder is removed. Surgical technique (open vs laparoscopic) was discussed. It was also discussed the goals  of the surgery (decrease the pain episodes and avoid the risk of cholecystitis) and the risk of surgery including: bleeding, infection, common bile duct injury, stone retention, injury to other organs such as bowel, liver, stomach, other complications such as hernia, bowel obstruction among others. Also discussed with patient about anesthesia and its complications such as: reaction to medications, pneumonia, heart complications, death, among others.   Labs today shows normal bilirubin level and normal lipase.  No sign of bile duct or pancreatic obstruction.  Cholelithiasis without cholecystitis [K80.20]  PLAN: 1.  Robotic assisted laparoscopic cholecystectomy (82800) 2.  CBC, CMP, lipase 3.  Do not take aspirin 5 days before the procedure 4.  Contact us if has any question or concern.   Patient verbalized understanding, all questions were answered, and were agreeable with the plan outlined above.   Herbert Pun, MD  Electronically signed by Herbert Pun, MD

## 2022-06-16 ENCOUNTER — Inpatient Hospital Stay: Admission: RE | Admit: 2022-06-16 | Payer: Medicaid Other | Source: Ambulatory Visit

## 2022-06-16 HISTORY — DX: Calculus of gallbladder without cholecystitis without obstruction: K80.20

## 2022-06-16 NOTE — Pre-Procedure Instructions (Signed)
Call to patient and wife's phone to ensure patient is aware of pre-op instructions and inquire about any new medication or health issues. No answer; message left. Surgery scheduled for Monday, July 31.

## 2022-06-19 MED ORDER — INDOCYANINE GREEN 25 MG IV SOLR
1.2500 mg | Freq: Once | INTRAVENOUS | Status: AC
  Administered 2022-06-20: 1.25 mg via INTRAVENOUS
  Filled 2022-06-19: qty 0.5

## 2022-06-20 ENCOUNTER — Other Ambulatory Visit: Payer: Self-pay

## 2022-06-20 ENCOUNTER — Encounter: Payer: Self-pay | Admitting: General Surgery

## 2022-06-20 ENCOUNTER — Ambulatory Visit
Admission: RE | Admit: 2022-06-20 | Discharge: 2022-06-20 | Disposition: A | Discharge status: Home / Self Care | Payer: Medicaid Other | Attending: General Surgery | Admitting: General Surgery

## 2022-06-20 ENCOUNTER — Encounter
Admission: RE | Disposition: A | Discharge status: Home / Self Care | Payer: Self-pay | Source: Home / Self Care | Attending: General Surgery

## 2022-06-20 ENCOUNTER — Ambulatory Visit: Payer: Medicaid Other | Admitting: Certified Registered Nurse Anesthetist

## 2022-06-20 DIAGNOSIS — K8010 Calculus of gallbladder with chronic cholecystitis without obstruction: Secondary | ICD-10-CM | POA: Diagnosis not present

## 2022-06-20 DIAGNOSIS — K8020 Calculus of gallbladder without cholecystitis without obstruction: Secondary | ICD-10-CM | POA: Diagnosis not present

## 2022-06-20 DIAGNOSIS — F419 Anxiety disorder, unspecified: Secondary | ICD-10-CM | POA: Diagnosis not present

## 2022-06-20 DIAGNOSIS — K219 Gastro-esophageal reflux disease without esophagitis: Secondary | ICD-10-CM | POA: Diagnosis not present

## 2022-06-20 DIAGNOSIS — K819 Cholecystitis, unspecified: Secondary | ICD-10-CM | POA: Diagnosis not present

## 2022-06-20 DIAGNOSIS — F17210 Nicotine dependence, cigarettes, uncomplicated: Secondary | ICD-10-CM | POA: Diagnosis not present

## 2022-06-20 DIAGNOSIS — F32A Depression, unspecified: Secondary | ICD-10-CM | POA: Insufficient documentation

## 2022-06-20 DIAGNOSIS — K760 Fatty (change of) liver, not elsewhere classified: Secondary | ICD-10-CM | POA: Diagnosis not present

## 2022-06-20 DIAGNOSIS — K449 Diaphragmatic hernia without obstruction or gangrene: Secondary | ICD-10-CM | POA: Insufficient documentation

## 2022-06-20 SURGERY — CHOLECYSTECTOMY, ROBOT-ASSISTED, LAPAROSCOPIC
Anesthesia: General | Site: Abdomen

## 2022-06-20 MED ORDER — ROCURONIUM BROMIDE 100 MG/10ML IV SOLN
INTRAVENOUS | Status: DC | PRN
  Administered 2022-06-20: 5 mg via INTRAVENOUS
  Administered 2022-06-20: 45 mg via INTRAVENOUS
  Administered 2022-06-20: 10 mg via INTRAVENOUS

## 2022-06-20 MED ORDER — CEFAZOLIN SODIUM-DEXTROSE 2-4 GM/100ML-% IV SOLN
2.0000 g | INTRAVENOUS | Status: AC
  Administered 2022-06-20: 2 g via INTRAVENOUS

## 2022-06-20 MED ORDER — FENTANYL CITRATE (PF) 100 MCG/2ML IJ SOLN
25.0000 ug | INTRAMUSCULAR | Status: DC | PRN
  Administered 2022-06-20 (×2): 25 ug via INTRAVENOUS

## 2022-06-20 MED ORDER — LACTATED RINGERS IV SOLN: INTRAVENOUS | Status: DC

## 2022-06-20 MED ORDER — CHLORHEXIDINE GLUCONATE 0.12 % MT SOLN: 15.0000 mL | Freq: Once | OROMUCOSAL | Status: AC

## 2022-06-20 MED ORDER — ONDANSETRON HCL 4 MG/2ML IJ SOLN
INTRAMUSCULAR | Status: AC
  Filled 2022-06-20: qty 2

## 2022-06-20 MED ORDER — CHLORHEXIDINE GLUCONATE 0.12 % MT SOLN
OROMUCOSAL | Status: AC
  Administered 2022-06-20: 15 mL via OROMUCOSAL
  Filled 2022-06-20: qty 15

## 2022-06-20 MED ORDER — BUPIVACAINE-EPINEPHRINE 0.25% -1:200000 IJ SOLN
INTRAMUSCULAR | Status: DC | PRN
  Administered 2022-06-20: 30 mL

## 2022-06-20 MED ORDER — FENTANYL CITRATE (PF) 100 MCG/2ML IJ SOLN
INTRAMUSCULAR | Status: AC
  Administered 2022-06-20: 25 ug via INTRAVENOUS
  Filled 2022-06-20: qty 2

## 2022-06-20 MED ORDER — ACETAMINOPHEN 10 MG/ML IV SOLN
INTRAVENOUS | Status: AC
  Filled 2022-06-20: qty 100

## 2022-06-20 MED ORDER — FENTANYL CITRATE (PF) 100 MCG/2ML IJ SOLN
INTRAMUSCULAR | Status: AC
  Filled 2022-06-20: qty 2

## 2022-06-20 MED ORDER — ACETAMINOPHEN 10 MG/ML IV SOLN
INTRAVENOUS | Status: DC | PRN
  Administered 2022-06-20: 1000 mg via INTRAVENOUS

## 2022-06-20 MED ORDER — DEXAMETHASONE SODIUM PHOSPHATE 10 MG/ML IJ SOLN
INTRAMUSCULAR | Status: AC
  Filled 2022-06-20: qty 1

## 2022-06-20 MED ORDER — MIDAZOLAM HCL 2 MG/2ML IJ SOLN
INTRAMUSCULAR | Status: DC | PRN
  Administered 2022-06-20: 2 mg via INTRAVENOUS

## 2022-06-20 MED ORDER — LIDOCAINE HCL (PF) 2 % IJ SOLN
INTRAMUSCULAR | Status: AC
  Filled 2022-06-20: qty 5

## 2022-06-20 MED ORDER — KETOROLAC TROMETHAMINE 30 MG/ML IJ SOLN
INTRAMUSCULAR | Status: AC
  Filled 2022-06-20: qty 1

## 2022-06-20 MED ORDER — SUGAMMADEX SODIUM 200 MG/2ML IV SOLN
INTRAVENOUS | Status: DC | PRN
  Administered 2022-06-20: 175 mg via INTRAVENOUS

## 2022-06-20 MED ORDER — ONDANSETRON HCL 4 MG/2ML IJ SOLN
INTRAMUSCULAR | Status: DC | PRN
  Administered 2022-06-20: 4 mg via INTRAVENOUS

## 2022-06-20 MED ORDER — ORAL CARE MOUTH RINSE: 15.0000 mL | Freq: Once | OROMUCOSAL | Status: AC

## 2022-06-20 MED ORDER — SUCCINYLCHOLINE CHLORIDE 200 MG/10ML IV SOSY
PREFILLED_SYRINGE | INTRAVENOUS | Status: DC | PRN
  Administered 2022-06-20: 120 mg via INTRAVENOUS

## 2022-06-20 MED ORDER — BUPIVACAINE-EPINEPHRINE (PF) 0.25% -1:200000 IJ SOLN
INTRAMUSCULAR | Status: AC
  Filled 2022-06-20: qty 30

## 2022-06-20 MED ORDER — MIDAZOLAM HCL 2 MG/2ML IJ SOLN
INTRAMUSCULAR | Status: AC
  Filled 2022-06-20: qty 2

## 2022-06-20 MED ORDER — LIDOCAINE HCL (CARDIAC) PF 100 MG/5ML IV SOSY
PREFILLED_SYRINGE | INTRAVENOUS | Status: DC | PRN
  Administered 2022-06-20: 100 mg via INTRAVENOUS

## 2022-06-20 MED ORDER — DEXAMETHASONE SODIUM PHOSPHATE 10 MG/ML IJ SOLN
INTRAMUSCULAR | Status: DC | PRN
  Administered 2022-06-20: 10 mg via INTRAVENOUS

## 2022-06-20 MED ORDER — ROCURONIUM BROMIDE 10 MG/ML (PF) SYRINGE
PREFILLED_SYRINGE | INTRAVENOUS | Status: AC
  Filled 2022-06-20: qty 10

## 2022-06-20 MED ORDER — PROPOFOL 10 MG/ML IV BOLUS
INTRAVENOUS | Status: DC | PRN
  Administered 2022-06-20: 170 mg via INTRAVENOUS

## 2022-06-20 MED ORDER — HYDROCODONE-ACETAMINOPHEN 5-325 MG PO TABS: 1.0000 | ORAL_TABLET | ORAL | 0 refills | Status: AC | PRN

## 2022-06-20 MED ORDER — SUCCINYLCHOLINE CHLORIDE 200 MG/10ML IV SOSY
PREFILLED_SYRINGE | INTRAVENOUS | Status: AC
  Filled 2022-06-20: qty 10

## 2022-06-20 MED ORDER — FENTANYL CITRATE (PF) 100 MCG/2ML IJ SOLN
INTRAMUSCULAR | Status: DC | PRN
  Administered 2022-06-20 (×3): 50 ug via INTRAVENOUS

## 2022-06-20 MED ORDER — KETOROLAC TROMETHAMINE 30 MG/ML IJ SOLN
INTRAMUSCULAR | Status: DC | PRN
  Administered 2022-06-20: 30 mg via INTRAVENOUS

## 2022-06-20 MED ORDER — CEFAZOLIN SODIUM-DEXTROSE 2-4 GM/100ML-% IV SOLN
INTRAVENOUS | Status: AC
  Filled 2022-06-20: qty 100

## 2022-06-20 MED ORDER — PROMETHAZINE HCL 25 MG/ML IJ SOLN: 6.2500 mg | INTRAMUSCULAR | Status: DC | PRN

## 2022-06-20 SURGICAL SUPPLY — 55 items
ADH SKN CLS APL DERMABOND .7 (GAUZE/BANDAGES/DRESSINGS) ×2
BAG PRESSURE INF REUSE 1000 (BAG) IMPLANT
BLADE SURG SZ11 CARB STEEL (BLADE) ×3 IMPLANT
CANNULA REDUC XI 12-8 STAPL (CANNULA) ×1
CANNULA REDUCER 12-8 DVNC XI (CANNULA) ×2 IMPLANT
CATH REDDICK CHOLANGI 4FR 50CM (CATHETERS) IMPLANT
CLIP LIGATING HEM O LOK PURPLE (MISCELLANEOUS) IMPLANT
CLIP LIGATING HEMO O LOK GREEN (MISCELLANEOUS) ×3 IMPLANT
DERMABOND ADVANCED (GAUZE/BANDAGES/DRESSINGS) ×1
DERMABOND ADVANCED .7 DNX12 (GAUZE/BANDAGES/DRESSINGS) ×2 IMPLANT
DRAPE ARM DVNC X/XI (DISPOSABLE) ×8 IMPLANT
DRAPE C-ARM XRAY 36X54 (DRAPES) IMPLANT
DRAPE COLUMN DVNC XI (DISPOSABLE) ×2 IMPLANT
DRAPE DA VINCI XI ARM (DISPOSABLE) ×4
DRAPE DA VINCI XI COLUMN (DISPOSABLE) ×1
ELECT REM PT RETURN 9FT ADLT (ELECTROSURGICAL) ×3
ELECTRODE REM PT RTRN 9FT ADLT (ELECTROSURGICAL) ×2 IMPLANT
GLOVE BIO SURGEON STRL SZ 6.5 (GLOVE) ×9 IMPLANT
GLOVE BIOGEL PI IND STRL 6.5 (GLOVE) ×4 IMPLANT
GLOVE BIOGEL PI INDICATOR 6.5 (GLOVE) ×5
GOWN STRL REUS W/ TWL LRG LVL3 (GOWN DISPOSABLE) ×6 IMPLANT
GOWN STRL REUS W/TWL LRG LVL3 (GOWN DISPOSABLE) ×15
GRASPER SUT TROCAR 14GX15 (MISCELLANEOUS) ×3 IMPLANT
IRRIGATOR SUCT 8 DISP DVNC XI (IRRIGATION / IRRIGATOR) IMPLANT
IRRIGATOR SUCTION 8MM XI DISP (IRRIGATION / IRRIGATOR)
IV CATH ANGIO 12GX3 LT BLUE (NEEDLE) IMPLANT
IV NS 1000ML (IV SOLUTION)
IV NS 1000ML BAXH (IV SOLUTION) IMPLANT
KIT PINK PAD W/HEAD ARE REST (MISCELLANEOUS) ×3
KIT PINK PAD W/HEAD ARM REST (MISCELLANEOUS) ×2 IMPLANT
LABEL OR SOLS (LABEL) ×3 IMPLANT
MANIFOLD NEPTUNE II (INSTRUMENTS) ×3 IMPLANT
NDL INSUFFLATION 14GA 120MM (NEEDLE) ×2 IMPLANT
NEEDLE HYPO 22GX1.5 SAFETY (NEEDLE) ×3 IMPLANT
NEEDLE INSUFFLATION 14GA 120MM (NEEDLE) ×3 IMPLANT
NS IRRIG 500ML POUR BTL (IV SOLUTION) ×3 IMPLANT
OBTURATOR OPTICAL STANDARD 8MM (TROCAR) ×1
OBTURATOR OPTICAL STND 8 DVNC (TROCAR) ×2
OBTURATOR OPTICALSTD 8 DVNC (TROCAR) ×2 IMPLANT
PACK LAP CHOLECYSTECTOMY (MISCELLANEOUS) ×3 IMPLANT
SEAL CANN UNIV 5-8 DVNC XI (MISCELLANEOUS) ×6 IMPLANT
SEAL XI 5MM-8MM UNIVERSAL (MISCELLANEOUS) ×3
SET TUBE SMOKE EVAC HIGH FLOW (TUBING) ×3 IMPLANT
SOLUTION ELECTROLUBE (MISCELLANEOUS) ×3 IMPLANT
SPIKE FLUID TRANSFER (MISCELLANEOUS) ×6 IMPLANT
SPONGE T-LAP 4X18 ~~LOC~~+RFID (SPONGE) IMPLANT
STAPLER CANNULA SEAL DVNC XI (STAPLE) ×2 IMPLANT
STAPLER CANNULA SEAL XI (STAPLE) ×1
SUT MNCRL 4-0 (SUTURE) ×6
SUT MNCRL 4-0 27XMFL (SUTURE) ×4
SUT VICRYL 0 AB UR-6 (SUTURE) ×3 IMPLANT
SUTURE MNCRL 4-0 27XMF (SUTURE) ×2 IMPLANT
SYS BAG RETRIEVAL 10MM (BASKET) ×3
SYSTEM BAG RETRIEVAL 10MM (BASKET) ×2 IMPLANT
WATER STERILE IRR 500ML POUR (IV SOLUTION) ×2 IMPLANT

## 2022-06-20 NOTE — Interval H&P Note (Signed)
History and Physical Interval Note:  06/20/2022 8:29 AM  Phillip Finley  has presented today for surgery, with the diagnosis of K80.20 cholelithiasis w/o cholecystitis.  The various methods of treatment have been discussed with the patient and family. After consideration of risks, benefits and other options for treatment, the patient has consented to  Procedure(s): XI ROBOTIC ASSISTED LAPAROSCOPIC CHOLECYSTECTOMY (N/A) Baker (ICG) (N/A) as a surgical intervention.  The patient's history has been reviewed, patient examined, no change in status, stable for surgery.  I have reviewed the patient's chart and labs.  Questions were answered to the patient's satisfaction.     Herbert Pun

## 2022-06-20 NOTE — Transfer of Care (Signed)
Immediate Anesthesia Transfer of Care Note  Patient: Phillip Finley  Procedure(s) Performed: XI ROBOTIC ASSISTED LAPAROSCOPIC CHOLECYSTECTOMY (Abdomen) INDOCYANINE GREEN FLUORESCENCE IMAGING (ICG)  Patient Location: PACU  Anesthesia Type:General  Level of Consciousness: awake, alert  and oriented  Airway & Oxygen Therapy: Patient Spontanous Breathing and Patient connected to face mask oxygen  Post-op Assessment: Report given to RN and Post -op Vital signs reviewed and stable  Post vital signs: Reviewed and stable  Last Vitals:  Vitals Value Taken Time  BP 152/92 06/20/22 1012  Temp 36.1 C 06/20/22 1012  Pulse 61 06/20/22 1012  Resp 13 06/20/22 1012  SpO2 100 % 06/20/22 1012  Vitals shown include unvalidated device data.  Last Pain:  Vitals:   06/20/22 0809  TempSrc: Temporal  PainSc: 2       Patients Stated Pain Goal: 0 (64/33/29 [id number])  Complications: No notable events documented.

## 2022-06-20 NOTE — Anesthesia Preprocedure Evaluation (Signed)
Anesthesia Evaluation  Patient identified by MRN, date of birth, ID band Patient awake    Reviewed: Allergy & Precautions, NPO status , Patient's Chart, lab work & pertinent test results  History of Anesthesia Complications Negative for: history of anesthetic complications  Airway Mallampati: II  TM Distance: >3 FB Neck ROM: Full    Dental  (+) Upper Dentures, Poor Dentition, Dental Advidsory Given   Pulmonary neg shortness of breath, neg sleep apnea, neg COPD, neg recent URI, Current Smoker and Patient abstained from smoking.,    breath sounds clear to auscultation- rhonchi (-) wheezing      Cardiovascular Exercise Tolerance: Good (-) hypertension(-) angina(-) CAD, (-) Past MI, (-) Cardiac Stents and (-) CABG (-) dysrhythmias (-) Valvular Problems/Murmurs Rhythm:Regular Rate:Normal - Systolic murmurs and - Diastolic murmurs    Neuro/Psych neg Seizures PSYCHIATRIC DISORDERS Anxiety Depression negative neurological ROS     GI/Hepatic hiatal hernia, GERD  ,NAFLD   Endo/Other  negative endocrine ROSneg diabetes  Renal/GU negative Renal ROS     Musculoskeletal  (+) Arthritis ,   Abdominal (+) - obese,   Peds  Hematology negative hematology ROS (+)   Anesthesia Other Findings Past Medical History: No date: Anxiety No date: Depression No date: GERD (gastroesophageal reflux disease)   Reproductive/Obstetrics                             Anesthesia Physical  Anesthesia Plan  ASA: 2  Anesthesia Plan: General   Post-op Pain Management:    Induction: Intravenous  PONV Risk Score and Plan: 0 and Ondansetron, Dexamethasone, Midazolam and Treatment may vary due to age or medical condition  Airway Management Planned: Oral ETT  Additional Equipment:   Intra-op Plan:   Post-operative Plan: Extubation in OR  Informed Consent: I have reviewed the patients History and Physical, chart, labs  and discussed the procedure including the risks, benefits and alternatives for the proposed anesthesia with the patient or authorized representative who has indicated his/her understanding and acceptance.     Dental advisory given  Plan Discussed with: CRNA and Anesthesiologist  Anesthesia Plan Comments:         Anesthesia Quick Evaluation

## 2022-06-20 NOTE — Discharge Instructions (Addendum)
  Diet: Resume home heart healthy regular diet.   Activity: No heavy lifting >20 pounds (children, pets, laundry, garbage) or strenuous activity until follow-up, but light activity and walking are encouraged. Do not drive or drink alcohol if taking narcotic pain medications.  Wound care: May shower with soapy water and pat dry (do not rub incisions), but no baths or submerging incision underwater until follow-up. (no swimming)   Medications: Resume all home medications. For mild to moderate pain: acetaminophen (Tylenol) or ibuprofen (if no kidney disease). Combining Tylenol with alcohol can substantially increase your risk of causing liver disease. Narcotic pain medications, if prescribed, can be used for severe pain, though may cause nausea, constipation, and drowsiness. Do not combine Tylenol and Norco within a 6 hour period as Norco contains Tylenol. If you do not need the narcotic pain medication, you do not need to fill the prescription.  Call office [phone]) at any time if any questions, worsening pain, fevers/chills, bleeding, drainage from incision site, or other concerns.   AMBULATORY SURGERY  DISCHARGE INSTRUCTIONS   The drugs that you were given will stay in your system until tomorrow so for the next 24 hours you should not:  Drive an automobile Make any legal decisions Drink any alcoholic beverage   You may resume regular meals tomorrow.  Today it is better to start with liquids and gradually work up to solid foods.  You may eat anything you prefer, but it is better to start with liquids, then soup and crackers, and gradually work up to solid foods.   Please notify your doctor immediately if you have any unusual bleeding, trouble breathing, redness and pain at the surgery site, drainage, fever, or pain not relieved by medication.     Additional Instructions:   TYLENOL 1000 MG GIVEN AT [facility] AT 9:53 AM - next dose can be taken, if needed per manufacturer's  instructions (review MD instructions regarding Norco as well.  Non-sterroidal antiinflammatory medication toradol gven at the hospital at 9:56 am; may take ibuprofen, if needed, pre manufacturer's instructions.

## 2022-06-20 NOTE — Anesthesia Procedure Notes (Signed)
Procedure Name: Intubation Date/Time: 06/20/2022 9:12 AM  Performed by: Demetrius Charity, CRNAPre-anesthesia Checklist: Patient identified, Patient being monitored, Timeout performed, Emergency Drugs available and Suction available Patient Re-evaluated:Patient Re-evaluated prior to induction Oxygen Delivery Method: Circle system utilized Preoxygenation: Pre-oxygenation with 100% oxygen Induction Type: IV induction Ventilation: Mask ventilation without difficulty Laryngoscope Size: 4 and McGraph Grade View: Grade II Tube type: Oral Tube size: 7.0 mm Number of attempts: 1 Airway Equipment and Method: Stylet Placement Confirmation: ETT inserted through vocal cords under direct vision, positive ETCO2 and breath sounds checked- equal and bilateral Secured at: 22 cm Tube secured with: Tape Dental Injury: Teeth and Oropharynx as per pre-operative assessment

## 2022-06-20 NOTE — Op Note (Signed)
Preoperative diagnosis: Cholelithiasis  Postoperative diagnosis: Same  Procedure: Robotic Assisted Laparoscopic Cholecystectomy.   Anesthesia: GETA   Surgeon: Dr. Windell Moment  Wound Classification: Clean Contaminated  Indications: Patient is a 51 y.o. male developed epigastric pain that radiates to the back and on workup was found to have cholelithiasis with a normal common duct. Robotic Assisted Laparoscopic cholecystectomy was elected.  Findings:  Critical view of safety achieved Cystic duct and artery identified, ligated and divided Adequate hemostasis     Description of procedure: The patient was placed on the operating table in the supine position. General anesthesia was induced. A time-out was completed verifying correct patient, procedure, site, positioning, and implant(s) and/or special equipment prior to beginning this procedure. An orogastric tube was placed. The abdomen was prepped and draped in the usual sterile fashion.  An incision was made in a natural skin line below the umbilicus.  The fascia was elevated and the Veress needle inserted. Proper position was confirmed by aspiration and saline meniscus test.  The abdomen was insufflated with carbon dioxide to a pressure of 15 mmHg. The patient tolerated insufflation well. A 8-mm trocar was then inserted in optiview fashion.  The laparoscope was inserted and the abdomen inspected. No injuries from initial trocar placement were noted. Additional trocars were then inserted in the following locations: an 8-mm trocar in the left lateral abdomen, and another two 8-mm trocars to the right side of the abdomen 5 cm appart. The umbilical trocar was changed to a 12 mm trocar all under direct visualization. The abdomen was inspected and no abnormalities were found. The table was placed in the reverse Trendelenburg position with the right side up. The robotic arms were docked and target anatomy identified. Instrument inserted under  direct visualization.  Filmy adhesions between the gallbladder and omentum, duodenum and transverse colon were lysed with electrocautery. The dome of the gallbladder was grasped with a prograsp and retracted over the dome of the liver. The infundibulum was also grasped with an atraumatic grasper and retracted toward the right lower quadrant. This maneuver exposed Calot's triangle. The peritoneum overlying the gallbladder infundibulum was then incised and the cystic duct and cystic artery identified and circumferentially dissected. Critical view of safety reviewed before ligating any structure. Firefly images taken to visualize biliary ducts. The cystic duct and cystic artery were then doubly clipped and divided close to the gallbladder.  The gallbladder was then dissected from its peritoneal attachments by electrocautery. Hemostasis was checked and the gallbladder and contained stones were removed using an endoscopic retrieval bag. The gallbladder was passed off the table as a specimen. There was no evidence of bleeding from the gallbladder fossa or cystic artery or leakage of the bile from the cystic duct stump. Secondary trocars were removed under direct vision. No bleeding was noted. The robotic arms were undoked. The scope was withdrawn and the umbilical trocar removed. The abdomen was allowed to collapse. The fascia of the 47m trocar sites was closed with figure-of-eight 0 vicryl sutures. The skin was closed with subcuticular sutures of 4-0 monocryl and topical skin adhesive. The orogastric tube was removed.  The patient tolerated the procedure well and was taken to the postanesthesia care unit in stable condition.   Specimen: Gallbladder  Complications: None  EBL: 5 mL

## 2022-06-20 NOTE — Anesthesia Postprocedure Evaluation (Signed)
Anesthesia Post Note  Patient: Phillip Finley  Procedure(s) Performed: XI ROBOTIC ASSISTED LAPAROSCOPIC CHOLECYSTECTOMY (Abdomen) INDOCYANINE GREEN FLUORESCENCE IMAGING (ICG)  Patient location during evaluation: PACU Anesthesia Type: General Level of consciousness: awake and alert Pain management: pain level controlled Vital Signs Assessment: post-procedure vital signs reviewed and stable Respiratory status: spontaneous breathing, nonlabored ventilation, respiratory function stable and patient connected to nasal cannula oxygen Cardiovascular status: blood pressure returned to baseline and stable Postop Assessment: no apparent nausea or vomiting Anesthetic complications: no   No notable events documented.   Last Vitals:  Vitals:   06/20/22 1045 06/20/22 1110  BP:  (!) 148/80  Pulse:  (!) 56  Resp:  16  Temp: (!) 36.1 C (!) 36.4 C  SpO2:  98%    Last Pain:  Vitals:   06/20/22 1110  TempSrc: Oral  PainSc: 2                  Martha Clan

## 2022-06-21 LAB — SURGICAL PATHOLOGY

## 2023-09-12 ENCOUNTER — Ambulatory Visit: Payer: Medicaid Other | Admitting: Family Medicine

## 2023-09-12 ENCOUNTER — Encounter: Payer: Self-pay | Admitting: Family Medicine

## 2023-09-12 VITALS — BP 122/74 | HR 65 | Ht 68.25 in | Wt 204.0 lb

## 2023-09-12 DIAGNOSIS — Z1211 Encounter for screening for malignant neoplasm of colon: Secondary | ICD-10-CM | POA: Diagnosis not present

## 2023-09-12 DIAGNOSIS — E782 Mixed hyperlipidemia: Secondary | ICD-10-CM

## 2023-09-12 DIAGNOSIS — F3341 Major depressive disorder, recurrent, in partial remission: Secondary | ICD-10-CM

## 2023-09-12 DIAGNOSIS — Z0000 Encounter for general adult medical examination without abnormal findings: Secondary | ICD-10-CM | POA: Diagnosis not present

## 2023-09-12 NOTE — Progress Notes (Signed)
Subjective:    Patient ID: Phillip Finley, male    DOB: 1971/10/19, 52 y.o.   MRN: [id number]  Phillip Finley is a 52 y.o. male presenting on 09/12/2023 for Annual Exam   HPI  Here for Annual Physical and Lab Review  Discussed the use of AI scribe software for clinical note transcription with the patient, who gave verbal consent to proceed.         Elevated A1c A1c at 5.5, previous 5.5 to 5.8 Meds: None Lifestyle:  - Diet (improved) Improving diet goals Denies hypoglycemia, polyuria, visual changes, numbness or tingling.   HYPERLIPIDEMIA: - Reports no concerns. Last lipid panel 01/2022, TG still down 180s, prior >300 in past, LDL remains 130s Not on statin therapy. No labs today    Chronic Neck pain / OA DJD He has chronic problem. Reduced ROM Previously - on Gabapentin 300mg  x 2 = 600mg  nightly, needs refill He said ortho, want to do surgery he is hesitant  Chronic Depression recurrent - partial remission Anxiety / Insomnia: He feels overall better with mental health, and remains OFF of medications, off SSRI Sertraline, or anything else for sleep or insomnia or anxiety He still has issues with mental health, but primarily concern is his physical health right now is the main stressor  Chronic GI symptoms Nausea S/p cholecystectomy / GERD / Barrett's Followed by GI and Surgery Dr Aleen Campi He has had negative gastric emptying study, CT showed cholelithiasis, he has some assoc symptoms of GERD / Barrett's esophagus. Treated with cholecystectomy and has had EGD. He has seen GI and unable to solve the problems  The patient also reported persistent feelings of sickness, including nausea, lack of energy, and frequent diarrhea, regardless of dietary intake. These symptoms have been ongoing for approximately three years and have significantly impacted the patient's quality of life, leading to feelings of frustration and anxiety. The patient reported that these symptoms  often occur soon after eating, sometimes within 30 minutes, and occur at least once a week.  The patient has a history of gallbladder removal, which did not alleviate the digestive issues. The patient also has Barrett's esophagus, a condition characterized by damage to the esophagus from stomach acid. However, a recent upper and lower endoscopy showed no significant changes compared to previous years.      Health Maintenance:  Interested in Coronary Artery Calcium Score   PSA 0.50 (01/2022)   Colonoscopy - multiple polyps done 08/2021, Dr Maximino Greenland, repeat anticipated for 08/2022. / Polyps only 1 was precancerous. He opts for Cologuard now.      09/12/2023    3:26 PM 02/02/2022    8:36 AM 08/04/2021    2:18 PM  Depression screen PHQ 2/9  Decreased Interest 1 1 1   Down, Depressed, Hopeless 2 1 1   PHQ - 2 Score 3 2 2   Altered sleeping 2 2 2   Tired, decreased energy 3 3 2   Change in appetite 1 1 0  Feeling bad or failure about yourself  2 1 1   Trouble concentrating 0 0 1  Moving slowly or fidgety/restless 1 1 1   Suicidal thoughts 1 0 0  PHQ-9 Score 13 10 9   Difficult doing work/chores Extremely dIfficult Somewhat difficult Very difficult   [place]-Suicide Severity Rating Scale 1) Have you wished you were dead or wished you could go to sleep and not wake up? - Yes  2) Have you had any actual thoughts of killing yourself? - No  Skip questions 3,4, 5  6) Have you ever done anything, started to do anything, or prepared to do anything to end your life? - No      09/12/2023    3:27 PM 02/02/2022    8:37 AM 08/04/2021    2:18 PM 06/28/2021   10:52 AM  GAD 7 : Generalized Anxiety Score  Nervous, Anxious, on Edge 2 1 2 2   Control/stop worrying 3 1 1 1   Worry too much - different things 3 1 2 1   Trouble relaxing 2 1 1 2   Restless 2 2 1 1   Easily annoyed or irritable 1 1 1 1   Afraid - awful might happen 3 2 2    Total GAD 7 Score 16 9 10    Anxiety Difficulty  Somewhat difficult  Very difficult Somewhat difficult      Past Medical History:  Diagnosis Date   Anginal pain (HCC)    acid reflux   Anxiety    Arthritis    Barrett's esophagus    Depression    Dyspnea    GERD (gastroesophageal reflux disease)    History of hiatal hernia    Hyperlipidemia    Pneumonia    Symptomatic cholelithiasis    Past Surgical History:  Procedure Laterality Date   BONE MARROW ASPIRATION     CHOLECYSTECTOMY     05/2022   COLONOSCOPY WITH PROPOFOL N/A 08/25/2021   Procedure: COLONOSCOPY WITH PROPOFOL;  Surgeon: Pasty Spillers, MD;  Location: [facility] ENDOSCOPY;  Service: Endoscopy;  Laterality: N/A;   ESOPHAGOGASTRODUODENOSCOPY N/A 08/25/2021   Procedure: ESOPHAGOGASTRODUODENOSCOPY (EGD);  Surgeon: Pasty Spillers, MD;  Location: [facility] ENDOSCOPY;  Service: Endoscopy;  Laterality: N/A;   nerve ablasion     In neck   Social History   Socioeconomic History   Marital status: Married    Spouse name: Kimberlee   Number of children: 3   Years of education: Not on file   Highest education level: Not on file  Occupational History   Not on file  Tobacco Use   Smoking status: Every Day    Current packs/day: 1.00    Average packs/day: 1 pack/day for 30.0 years (30.0 ttl pk-yrs)    Types: Cigarettes   Smokeless tobacco: Never  Vaping Use   Vaping status: Never Used  Substance and Sexual Activity   Alcohol use: Yes    Comment: seldom   Drug use: No   Sexual activity: Yes    Partners: Female    Birth control/protection: None  Other Topics Concern   Not on file  Social History Narrative   Not on file   Social Determinants of Health   Financial Resource Strain: Not on file  Food Insecurity: Not on file  Transportation Needs: Not on file  Physical Activity: Not on file  Stress: Not on file  Social Connections: Not on file  Intimate Partner Violence: Not on file   Family History  Problem Relation Age of Onset   Heart disease Father    Heart attack Father  8   Hyperlipidemia Mother    Hypertension Mother    No current outpatient medications on file prior to visit.   No current facility-administered medications on file prior to visit.    Review of Systems  Constitutional:  Negative for activity change, appetite change, chills, diaphoresis, fatigue and fever.  HENT:  Negative for congestion and hearing loss.   Eyes:  Negative for visual disturbance.  Respiratory:  Negative for cough, chest tightness, shortness of breath and wheezing.  Cardiovascular:  Negative for chest pain, palpitations and leg swelling.  Gastrointestinal:  Positive for diarrhea and nausea. Negative for abdominal pain, constipation and vomiting.  Genitourinary:  Negative for dysuria, frequency and hematuria.  Musculoskeletal:  Negative for arthralgias and neck pain.  Skin:  Negative for rash.  Neurological:  Negative for dizziness, weakness, light-headedness, numbness and headaches.  Hematological:  Negative for adenopathy.  Psychiatric/Behavioral:  Positive for dysphoric mood. Negative for behavioral problems and sleep disturbance. The patient is nervous/anxious.    Per HPI unless specifically indicated above      Objective:    BP 122/74   Pulse 65   Ht 5' 8.25" (1.734 m)   Wt 204 lb (92.5 kg)   SpO2 99%   BMI 30.79 kg/m   Wt Readings from Last 3 Encounters:  09/12/23 204 lb (92.5 kg)  06/20/22 193 lb (87.5 kg)  05/30/22 197 lb (89.4 kg)    Physical Exam Vitals and nursing note reviewed.  Constitutional:      General: He is not in acute distress.    Appearance: He is well-developed. He is not diaphoretic.     Comments: Well-appearing, comfortable, cooperative  HENT:     Head: Normocephalic and atraumatic.  Eyes:     General:        Right eye: No discharge.        Left eye: No discharge.     Conjunctiva/sclera: Conjunctivae normal.     Pupils: Pupils are equal, round, and reactive to light.  Neck:     Thyroid: No thyromegaly.  Cardiovascular:      Rate and Rhythm: Normal rate and regular rhythm.     Pulses: Normal pulses.     Heart sounds: Normal heart sounds. No murmur heard. Pulmonary:     Effort: Pulmonary effort is normal. No respiratory distress.     Breath sounds: Normal breath sounds. No wheezing or rales.  Abdominal:     General: Bowel sounds are normal. There is no distension.     Palpations: Abdomen is soft. There is no mass.     Tenderness: There is no abdominal tenderness.  Musculoskeletal:        General: No tenderness. Normal range of motion.     Cervical back: Normal range of motion and neck supple.     Comments: Upper / Lower Extremities: - Normal muscle tone, strength bilateral upper extremities 5/5, lower extremities 5/5  Lymphadenopathy:     Cervical: No cervical adenopathy.  Skin:    General: Skin is warm and dry.     Findings: No erythema or rash.  Neurological:     Mental Status: He is alert and oriented to person, place, and time.     Comments: Distal sensation intact to light touch all extremities  Psychiatric:        Mood and Affect: Mood normal.        Behavior: Behavior normal.        Thought Content: Thought content normal.     Comments: Well groomed, good eye contact, normal speech and thoughts      Results for orders placed or performed during the hospital encounter of 06/20/22  Surgical pathology  Result Value Ref Range   SURGICAL PATHOLOGY      SURGICAL PATHOLOGY CASE: [id number] PATIENT: Georganna Skeans Surgical Pathology Report     Specimen Submitted: A. Gallbladder  Clinical History: K80.20 cholelithiasis w/o cholecystitis      DIAGNOSIS: A.  GALLBLADDER: CHOLECYSTECTOMY: - CHOLECYSTITIS. - CHOLELITHIASIS.  GROSS DESCRIPTION: A. Labeled: Gallbladder Received: Fresh Collection time: 9:56 AM on 06/20/2022 Placed into formalin time: 10:12 AM on 06/20/2022 Size of specimen: 7.5 x 3.4 x 1.7 cm Specimen integrity: Intact External surface: Blue and glistening  with a roughened hepatic surface and a moderate amount of adherent fibrofatty tissue Wall thickness: 0.1 cm Mucosa: Tan and velvety Cystic duct: The cystic duct is 0.5 x 0.4 x 0.1 cm and patent.  No adjacent lymph nodes are identified. Bile present: Yes, green and viscous Stones present: Yes, multiple yellow stones are present within the lumen, 1.0 x 0.4 x 0.4 cm in aggregate Other findings: None noted  Block summary: 1 -cystic duct m argin, en face and inked black with representative wall  CM 06/20/2022  Final Diagnosis performed by Alcario Drought, MD.   Electronically signed 06/21/2022 9:12:59AM The electronic signature indicates that the named Attending Pathologist has evaluated the specimen Technical component performed at [facility], [address] [place], [place] [address] Lab: [phone] Dir: Jolene Schimke, MD, MMM  Professional component performed at [facility], [facility], [address] [place], [place], [place] [address] Lab: [phone] Dir: Beryle Quant, MD       Assessment & Plan:   Problem List Items Addressed This Visit     Colon cancer screening   Relevant Orders   Cologuard   Hyperlipidemia   Relevant Orders   CT CARDIAC SCORING (SELF PAY ONLY)   Major depressive disorder, recurrent, in partial remission (HCC)   Other Visit Diagnoses     Annual physical exam    -  Primary      Updated Health Maintenance information No labs completed, he opts out of labs today Encouraged improvement to lifestyle with diet and exercise Goal of weight loss  Assessment and Plan    Colon Polyps History of multiple polyps, one of which was precancerous. Last colonoscopy was in 2022. Discussed the risks/benefits of Cologuard vs colonoscopy. -Order Cologuard test. If positive, refer back to GI for colonoscopy.  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 Symptoms may be suggestive of exocrine pancreatic  insufficiency. -Provide sample of Zenpep (pancreatic enzyme replacement) to take with meals. If symptoms improve, refer to GI for further management.  Coronary Artery Disease Screening Discussed the benefits of coronary artery calcium scoring for early detection of coronary artery disease. -Order coronary artery calcium score.  Lung Cancer Screening Active smoker, eligible for lung cancer screening. Discussed the benefits of lung cancer screening. -Consider lung cancer screening CT scan.  Major Depression recurrent partial remission He reports mood is improved overall but still present impacting his function assoc with anxiety stressors. He describes his physical health conditions are priority. He is off medications for mental health.  General Health Maintenance -Defer shingles vaccine. -Consider fasting blood glucose test. -Consider complete blood count and comprehensive metabolic panel. Offered all lab tests, he declines today -Follow-up in 1 year or sooner if needed.         Orders Placed This Encounter  Procedures   CT CARDIAC SCORING (SELF PAY ONLY)    Standing Status:   Future    Standing Expiration Date:   09/11/2024    Order Specific Question:   Preferred imaging location?    Answer:   [facility]   Cologuard     No orders of the defined types were placed in this encounter.     Follow up plan: Return in about 1 year (around 09/11/2024) for 1 year  Annual Physical.  Saralyn Pilar, DO [facility] 09/12/2023, 3:47 PM

## 2023-09-12 NOTE — Patient Instructions (Addendum)
Thank you for coming to the office today.  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 If this works, then I would suggest following back with GI to discuss further treatment of this.  --------------------  Ordered the Cologuard (home kit) test for colon cancer screening. Stay tuned for further updates.  It will be shipped to you directly. If not received in 2-4 weeks, call us or the company.   If you send it back and no results are received in 2-4 weeks, call us or the company as well!   Colon Cancer Screening: - For all adults age 52+ routine colon cancer screening is highly recommended.     - Recent guidelines from American [employer] Society recommend starting age of 4 - Early detection of colon cancer is important, because often there are no warning signs or symptoms, also if found early usually it can be cured. Late stage is hard to treat.   - If Cologuard is NEGATIVE, then it is good for 3 years before next due - If Cologuard is POSITIVE, then it is strongly advised to get a Colonoscopy, which allows the GI doctor to locate the source of the cancer or polyp (even very early stage) and treat it by removing it. ------------------------- Follow instructions to collect sample, you may call the company for any help or questions, 24/7 telephone support at [phone].  -----------------------------------------------------   You have been referred for a Coronary Calcium Score Cardiac CT Scan. This is a screening test for patients aged 52-50+ with cardiovascular risk factors or who are healthy but would be interested in Cardiovascular Screening for heart disease. Even if there is a family history of heart disease, this imaging can be useful. Typically it can be done every 5+ years or at a different timeline we agree on  The scan will look at the chest and mainly focus on  the heart and identify early signs of calcium build up or blockages within the heart arteries. It is not 100% accurate for identifying blockages or heart disease, but it is useful to help [place] predict who may have some early changes or be at risk in the future for a heart attack or cardiovascular problem.  The results are reviewed by a Cardiologist and they will document the results. It should become available on MyChart. Typically the results are divided into percentiles based on other patients of the same demographic and age. So it will compare your risk to others similar to you. If you have a higher score >99 or higher percentile >75%tile, it is recommended to consider Statin cholesterol therapy and or referral to Cardiologist. I will try to help explain your results and if we have questions we can contact the Cardiologist.  You will be contacted for scheduling. Usually it is done at any imaging facility through [facility], [facility] or [facility].  The cost is $99 flat fee total and it does not go through insurance, so no authorization is required.    Please schedule a Follow-up Appointment to: Return in about 1 year (around 09/11/2024) for 1 year Annual Physical.  If you have any other questions or concerns, please feel free to call the office or send a message through MyChart. You may also schedule an earlier appointment if necessary.  Additionally, you may be receiving a survey about your experience at our office within a  few days to 1 week by e-mail or mail. We value your feedback.  Saralyn Pilar, DO [facility], [place]

## 2023-09-20 ENCOUNTER — Other Ambulatory Visit: Payer: Medicaid Other

## 2023-09-21 ENCOUNTER — Ambulatory Visit
Admission: RE | Admit: 2023-09-21 | Discharge: 2023-09-21 | Disposition: A | Discharge status: Home / Self Care | Payer: Medicaid Other | Source: Ambulatory Visit | Attending: Family Medicine | Admitting: Family Medicine

## 2023-09-21 DIAGNOSIS — E782 Mixed hyperlipidemia: Secondary | ICD-10-CM | POA: Insufficient documentation

## 2023-12-12 ENCOUNTER — Encounter: Payer: Self-pay | Admitting: Family Medicine

## 2023-12-12 ENCOUNTER — Ambulatory Visit: Payer: 59 | Admitting: Family Medicine

## 2023-12-12 VITALS — BP 132/64 | HR 63 | Ht 68.25 in | Wt 209.0 lb

## 2023-12-12 DIAGNOSIS — K580 Irritable bowel syndrome with diarrhea: Secondary | ICD-10-CM | POA: Diagnosis not present

## 2023-12-12 MED ORDER — NORTRIPTYLINE HCL 10 MG PO CAPS: 10.0000 mg | ORAL_CAPSULE | Freq: Every day | ORAL | 2 refills | Status: AC

## 2023-12-12 NOTE — Progress Notes (Signed)
Subjective:    Patient ID: Phillip Finley, male    DOB: 08-07-1971, 53 y.o.   MRN: [id number]  Phillip Finley is a 53 y.o. male presenting on 12/12/2023 for IBS / Chronic Bowel Problems   HPI  Discussed the use of AI scribe software for clinical note transcription with the patient, who gave verbal consent to proceed.  History of Present Illness     Chronic Neck pain / OA DJD He has chronic problem. Reduced ROM Previously - on Gabapentin 300mg  x 2 = 600mg  nightly Off meds now. He has deferred surgery Also Knee Osteoarthritis with pain.  Chronic Depression recurrent - partial remission Anxiety / Insomnia: He feels overall better with mental health, and remains OFF of medications, off SSRI Sertraline, or anything else for sleep or insomnia or anxiety He still has issues with mental health, but primarily concern is his physical health right now is the main stressor, also issues with financial and other stress  Chronic GI symptoms Possible IBS / Diarrhea / Nausea S/p cholecystectomy / GERD / Barrett's Followed by GI and Surgery Dr Aleen Campi He has had negative gastric emptying study, CT showed cholelithiasis, he has some assoc symptoms of GERD / Barrett's esophagus. Treated with cholecystectomy and has had EGD. He has seen GI and unable to solve the problems  Last visit 08/2023, tried Zenpep sample and did not resolve diarrhea. He still has daily diarrhea. He may have 1-2 x per week with semi solid soft BM. Off Pancreatic enzyme now. - Also, He has been homeopathic physician, questioning liver etiology, asking for labs.       12/12/2023    9:33 AM 09/12/2023    3:26 PM 02/02/2022    8:36 AM  Depression screen PHQ 2/9  Decreased Interest 1 1 1   Down, Depressed, Hopeless 2 2 1   PHQ - 2 Score 3 3 2   Altered sleeping 3 2 2   Tired, decreased energy 3 3 3   Change in appetite 1 1 1   Feeling bad or failure about yourself  1 2 1   Trouble concentrating 0 0 0  Moving slowly  or fidgety/restless 1 1 1   Suicidal thoughts 1 1 0  PHQ-9 Score 13 13 10   Difficult doing work/chores Somewhat difficult Extremely dIfficult Somewhat difficult   [place]-Suicide Severity Rating Scale 1) Have you wished you were dead or wished you could go to sleep and not wake up? - Yes  2) Have you had any actual thoughts of killing yourself? - No  Skip questions 3,4, 5  6) Have you ever done anything, started to do anything, or prepared to do anything to end your life? - No      12/12/2023    9:34 AM 09/12/2023    3:27 PM 02/02/2022    8:37 AM 08/04/2021    2:18 PM  GAD 7 : Generalized Anxiety Score  Nervous, Anxious, on Edge 1 2 1 2   Control/stop worrying 1 3 1 1   Worry too much - different things 2 3 1 2   Trouble relaxing 0 2 1 1   Restless 1 2 2 1   Easily annoyed or irritable 1 1 1 1   Afraid - awful might happen 1 3 2 2   Total GAD 7 Score 7 16 9 10   Anxiety Difficulty Somewhat difficult  Somewhat difficult Very difficult    Social History   Tobacco Use   Smoking status: Every Day    Current packs/day: 1.00    Average packs/day: 1 pack/day  for 30.0 years (30.0 ttl pk-yrs)    Types: Cigarettes   Smokeless tobacco: Never  Vaping Use   Vaping status: Never Used  Substance Use Topics   Alcohol use: Yes    Comment: seldom   Drug use: No    Review of Systems Per HPI unless specifically indicated above     Objective:    BP 132/64   Pulse 63   Ht 5' 8.25" (1.734 m)   Wt 209 lb (94.8 kg)   SpO2 99%   BMI 31.55 kg/m   Wt Readings from Last 3 Encounters:  12/12/23 209 lb (94.8 kg)  09/12/23 204 lb (92.5 kg)  06/20/22 193 lb (87.5 kg)    Physical Exam Vitals and nursing note reviewed.  Constitutional:      General: He is not in acute distress.    Appearance: Normal appearance. He is well-developed. He is not diaphoretic.     Comments: Well-appearing, comfortable, cooperative  HENT:     Head: Normocephalic and atraumatic.  Eyes:     General:         Right eye: No discharge.        Left eye: No discharge.     Conjunctiva/sclera: Conjunctivae normal.  Cardiovascular:     Rate and Rhythm: Normal rate.  Pulmonary:     Effort: Pulmonary effort is normal.  Skin:    General: Skin is warm and dry.     Findings: No erythema or rash.  Neurological:     Mental Status: He is alert and oriented to person, place, and time.  Psychiatric:        Mood and Affect: Mood normal.        Behavior: Behavior normal.        Thought Content: Thought content normal.     Comments: Well groomed, good eye contact, normal speech and thoughts     Results for orders placed or performed during the hospital encounter of 06/20/22  Surgical pathology   Collection Time: 06/20/22  9:56 AM  Result Value Ref Range   SURGICAL PATHOLOGY      SURGICAL PATHOLOGY CASE: [id number] PATIENT: Georganna Skeans Surgical Pathology Report     Specimen Submitted: A. Gallbladder  Clinical History: K80.20 cholelithiasis w/o cholecystitis      DIAGNOSIS: A.  GALLBLADDER: CHOLECYSTECTOMY: - CHOLECYSTITIS. - CHOLELITHIASIS.   GROSS DESCRIPTION: A. Labeled: Gallbladder Received: Fresh Collection time: 9:56 AM on 06/20/2022 Placed into formalin time: 10:12 AM on 06/20/2022 Size of specimen: 7.5 x 3.4 x 1.7 cm Specimen integrity: Intact External surface: Blue and glistening with a roughened hepatic surface and a moderate amount of adherent fibrofatty tissue Wall thickness: 0.1 cm Mucosa: Tan and velvety Cystic duct: The cystic duct is 0.5 x 0.4 x 0.1 cm and patent.  No adjacent lymph nodes are identified. Bile present: Yes, green and viscous Stones present: Yes, multiple yellow stones are present within the lumen, 1.0 x 0.4 x 0.4 cm in aggregate Other findings: None noted  Block summary: 1 -cystic duct m argin, en face and inked black with representative wall  CM 06/20/2022  Final Diagnosis performed by Alcario Drought, MD.   Electronically signed 06/21/2022  9:12:59AM The electronic signature indicates that the named Attending Pathologist has evaluated the specimen Technical component performed at [facility], [address], [place], [place] [address] Lab: [phone] Dir: Jolene Schimke, MD, MMM  Professional component performed at [facility], [facility], [address] [place], [place], [place] [address] Lab: [phone] Dir: Beryle Quant,  MD       Assessment & Plan:   Problem List Items Addressed This Visit   None Visit Diagnoses       Irritable bowel syndrome with diarrhea    -  Primary   Relevant Medications   nortriptyline (PAMELOR) 10 MG capsule   Other Relevant Orders   COMPLETE METABOLIC PANEL WITH GFR   CBC with Differential/Platelet        Chronic Diarrhea / Suspected IBS 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 No improvement with pancreatic enzymes. Anxiety may be contributing to symptoms. Suspect IBS triggered Prior GI and Gen Surgery eval in past. No further follow up with GI since cholecystectomy.  -Start Nortriptyline 10mg  at bedtime to potentially reduce diarrhea and IBS-related discomfort. -Consider re-evaluation by GI specialist given persistent symptoms post-gallbladder removal. May return to [facility] or other location if preferred. At this point consider larger medical center location.  Anxiety Contributing to overall symptomatology and possibly exacerbating gastrointestinal symptoms. Off of other medication. No longer SSRI -Continue current management strategies. Monitor response to Nortriptyline as it may have ancillary benefits for anxiety.  General Health Maintenance -Order complete metabolic panel and blood count given it has been almost two years since last panel and patient has ongoing symptoms. -Consider annual follow-up with basic blood work.      Orders Placed This Encounter  Procedures   COMPLETE METABOLIC PANEL WITH GFR   CBC with Differential/Platelet    Meds ordered this encounter   Medications   nortriptyline (PAMELOR) 10 MG capsule    Sig: Take 1 capsule (10 mg total) by mouth at bedtime.    Dispense:  30 capsule    Refill:  2    Follow up plan: Return if symptoms worsen or fail to improve.    Saralyn Pilar, DO [facility] 12/12/2023, 9:54 AM

## 2023-12-12 NOTE — Patient Instructions (Addendum)
Thank you for coming to the office today.  Start Nortriptyline 10mg  bedtime for IBS symptoms diarrhea and pain symptoms Also can help nerve pain  Labs today for liver and blood count.  Please schedule a Follow-up Appointment to: Return if symptoms worsen or fail to improve.  If you have any other questions or concerns, please feel free to call the office or send a message through MyChart. You may also schedule an earlier appointment if necessary.  Additionally, you may be receiving a survey about your experience at our office within a few days to 1 week by e-mail or mail. We value your feedback.  Saralyn Pilar, DO [facility], [place]
